# Patient Record
Sex: Female | Born: 1935 | Race: White | Hispanic: No | Marital: Married | Smoking: Never smoker
Health system: Southern US, Community
[De-identification: ages and names within clinical notes are randomized; demographics above are authoritative.]

## PROBLEM LIST (undated history)

## (undated) DIAGNOSIS — K5289 Other specified noninfective gastroenteritis and colitis: Secondary | ICD-10-CM

## (undated) DIAGNOSIS — D649 Anemia, unspecified: Secondary | ICD-10-CM

## (undated) DIAGNOSIS — F419 Anxiety disorder, unspecified: Secondary | ICD-10-CM

## (undated) DIAGNOSIS — J189 Pneumonia, unspecified organism: Secondary | ICD-10-CM

## (undated) DIAGNOSIS — M1710 Unilateral primary osteoarthritis, unspecified knee: Secondary | ICD-10-CM

## (undated) DIAGNOSIS — R51 Headache: Secondary | ICD-10-CM

## (undated) DIAGNOSIS — M810 Age-related osteoporosis without current pathological fracture: Secondary | ICD-10-CM

## (undated) DIAGNOSIS — T8859XA Other complications of anesthesia, initial encounter: Secondary | ICD-10-CM

## (undated) DIAGNOSIS — M179 Osteoarthritis of knee, unspecified: Secondary | ICD-10-CM

## (undated) DIAGNOSIS — K449 Diaphragmatic hernia without obstruction or gangrene: Secondary | ICD-10-CM

## (undated) DIAGNOSIS — B91 Sequelae of poliomyelitis: Secondary | ICD-10-CM

## (undated) DIAGNOSIS — E785 Hyperlipidemia, unspecified: Secondary | ICD-10-CM

## (undated) DIAGNOSIS — F411 Generalized anxiety disorder: Secondary | ICD-10-CM

## (undated) DIAGNOSIS — I10 Essential (primary) hypertension: Secondary | ICD-10-CM

## (undated) DIAGNOSIS — J309 Allergic rhinitis, unspecified: Secondary | ICD-10-CM

## (undated) DIAGNOSIS — K5790 Diverticulosis of intestine, part unspecified, without perforation or abscess without bleeding: Secondary | ICD-10-CM

## (undated) DIAGNOSIS — I739 Peripheral vascular disease, unspecified: Secondary | ICD-10-CM

## (undated) DIAGNOSIS — I517 Cardiomegaly: Secondary | ICD-10-CM

## (undated) DIAGNOSIS — M1990 Unspecified osteoarthritis, unspecified site: Secondary | ICD-10-CM

## (undated) DIAGNOSIS — E559 Vitamin D deficiency, unspecified: Secondary | ICD-10-CM

## (undated) DIAGNOSIS — K219 Gastro-esophageal reflux disease without esophagitis: Secondary | ICD-10-CM

## (undated) DIAGNOSIS — E7800 Pure hypercholesterolemia, unspecified: Secondary | ICD-10-CM

## (undated) DIAGNOSIS — M7120 Synovial cyst of popliteal space [Baker], unspecified knee: Secondary | ICD-10-CM

## (undated) DIAGNOSIS — M76899 Other specified enthesopathies of unspecified lower limb, excluding foot: Secondary | ICD-10-CM

## (undated) DIAGNOSIS — T4145XA Adverse effect of unspecified anesthetic, initial encounter: Secondary | ICD-10-CM

## (undated) DIAGNOSIS — I6529 Occlusion and stenosis of unspecified carotid artery: Secondary | ICD-10-CM

## (undated) HISTORY — DX: Diaphragmatic hernia without obstruction or gangrene: K44.9

## (undated) HISTORY — DX: Unilateral primary osteoarthritis, unspecified knee: M17.10

## (undated) HISTORY — DX: Osteoarthritis of knee, unspecified: M17.9

## (undated) HISTORY — PX: COLONOSCOPY: SHX174

## (undated) HISTORY — PX: OTHER SURGICAL HISTORY: SHX169

## (undated) HISTORY — DX: Cardiomegaly: I51.7

## (undated) HISTORY — DX: Sequelae of poliomyelitis: B91

## (undated) HISTORY — DX: Hyperlipidemia, unspecified: E78.5

## (undated) HISTORY — DX: Headache: R51

## (undated) HISTORY — DX: Anxiety disorder, unspecified: F41.9

## (undated) HISTORY — DX: Other specified noninfective gastroenteritis and colitis: K52.89

## (undated) HISTORY — DX: Pneumonia, unspecified organism: J18.9

## (undated) HISTORY — DX: Other specified enthesopathies of unspecified lower limb, excluding foot: M76.899

## (undated) HISTORY — DX: Gastro-esophageal reflux disease without esophagitis: K21.9

## (undated) HISTORY — DX: Generalized anxiety disorder: F41.1

## (undated) HISTORY — DX: Vitamin D deficiency, unspecified: E55.9

## (undated) HISTORY — DX: Allergic rhinitis, unspecified: J30.9

## (undated) HISTORY — DX: Pure hypercholesterolemia, unspecified: E78.00

## (undated) HISTORY — DX: Peripheral vascular disease, unspecified: I73.9

## (undated) HISTORY — DX: Occlusion and stenosis of unspecified carotid artery: I65.29

## (undated) HISTORY — DX: Anemia, unspecified: D64.9

## (undated) HISTORY — DX: Unspecified osteoarthritis, unspecified site: M19.90

## (undated) HISTORY — DX: Synovial cyst of popliteal space (Baker), unspecified knee: M71.20

## (undated) HISTORY — DX: Diverticulosis of intestine, part unspecified, without perforation or abscess without bleeding: K57.90

## (undated) HISTORY — DX: Age-related osteoporosis without current pathological fracture: M81.0

## (undated) HISTORY — PX: TONSILLECTOMY AND ADENOIDECTOMY: SHX28

## (undated) HISTORY — DX: Essential (primary) hypertension: I10

---
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
## 1990-04-09 HISTORY — PX: BREAST BIOPSY: SHX20

## 1992-04-09 HISTORY — PX: ENDOMETRIAL BIOPSY: SHX622

## 1999-08-09 ENCOUNTER — Ambulatory Visit: Admission: RE | Admit: 1999-08-09 | Discharge: 1999-08-09 | Payer: Self-pay | Admitting: Internal Medicine

## 2001-01-20 ENCOUNTER — Other Ambulatory Visit: Admission: RE | Admit: 2001-01-20 | Discharge: 2001-01-20 | Payer: Self-pay | Admitting: Obstetrics and Gynecology

## 2002-03-10 ENCOUNTER — Other Ambulatory Visit: Admission: RE | Admit: 2002-03-10 | Discharge: 2002-03-10 | Payer: Self-pay | Admitting: Gynecology

## 2002-03-24 ENCOUNTER — Ambulatory Visit (HOSPITAL_COMMUNITY): Admission: RE | Admit: 2002-03-24 | Discharge: 2002-03-24 | Payer: Self-pay | Admitting: Internal Medicine

## 2002-03-24 ENCOUNTER — Encounter: Payer: Self-pay | Admitting: Internal Medicine

## 2003-03-18 ENCOUNTER — Emergency Department (HOSPITAL_COMMUNITY): Admission: EM | Admit: 2003-03-18 | Discharge: 2003-03-18 | Payer: Self-pay | Admitting: Emergency Medicine

## 2003-06-09 ENCOUNTER — Other Ambulatory Visit: Admission: RE | Admit: 2003-06-09 | Discharge: 2003-06-09 | Payer: Self-pay | Admitting: Gynecology

## 2004-06-07 ENCOUNTER — Ambulatory Visit: Payer: Self-pay | Admitting: Internal Medicine

## 2004-09-06 ENCOUNTER — Ambulatory Visit: Payer: Self-pay | Admitting: Internal Medicine

## 2004-09-11 ENCOUNTER — Ambulatory Visit: Payer: Self-pay | Admitting: Internal Medicine

## 2004-09-25 ENCOUNTER — Ambulatory Visit: Payer: Self-pay

## 2005-03-29 ENCOUNTER — Ambulatory Visit: Payer: Self-pay | Admitting: Internal Medicine

## 2005-04-30 ENCOUNTER — Ambulatory Visit: Payer: Self-pay | Admitting: Internal Medicine

## 2005-05-01 ENCOUNTER — Ambulatory Visit: Payer: Self-pay | Admitting: Internal Medicine

## 2005-05-17 ENCOUNTER — Ambulatory Visit: Payer: Self-pay | Admitting: Internal Medicine

## 2005-06-07 ENCOUNTER — Ambulatory Visit (HOSPITAL_BASED_OUTPATIENT_CLINIC_OR_DEPARTMENT_OTHER): Admission: RE | Admit: 2005-06-07 | Discharge: 2005-06-07 | Payer: Self-pay | Admitting: Orthopedic Surgery

## 2005-08-07 ENCOUNTER — Other Ambulatory Visit: Admission: RE | Admit: 2005-08-07 | Discharge: 2005-08-07 | Payer: Self-pay | Admitting: Gynecology

## 2005-11-14 ENCOUNTER — Ambulatory Visit: Payer: Self-pay | Admitting: Internal Medicine

## 2005-11-16 ENCOUNTER — Ambulatory Visit: Payer: Self-pay | Admitting: Internal Medicine

## 2005-11-17 ENCOUNTER — Ambulatory Visit: Payer: Self-pay | Admitting: Internal Medicine

## 2006-07-08 ENCOUNTER — Ambulatory Visit: Payer: Self-pay | Admitting: Internal Medicine

## 2006-11-30 ENCOUNTER — Encounter: Payer: Self-pay | Admitting: Internal Medicine

## 2006-11-30 DIAGNOSIS — B91 Sequelae of poliomyelitis: Secondary | ICD-10-CM

## 2006-11-30 DIAGNOSIS — M7120 Synovial cyst of popliteal space [Baker], unspecified knee: Secondary | ICD-10-CM

## 2006-11-30 DIAGNOSIS — K219 Gastro-esophageal reflux disease without esophagitis: Secondary | ICD-10-CM | POA: Insufficient documentation

## 2006-11-30 DIAGNOSIS — M1990 Unspecified osteoarthritis, unspecified site: Secondary | ICD-10-CM

## 2006-11-30 DIAGNOSIS — F411 Generalized anxiety disorder: Secondary | ICD-10-CM | POA: Insufficient documentation

## 2006-11-30 DIAGNOSIS — E7800 Pure hypercholesterolemia, unspecified: Secondary | ICD-10-CM

## 2006-11-30 DIAGNOSIS — M899 Disorder of bone, unspecified: Secondary | ICD-10-CM | POA: Insufficient documentation

## 2006-11-30 DIAGNOSIS — M949 Disorder of cartilage, unspecified: Secondary | ICD-10-CM

## 2006-11-30 DIAGNOSIS — I10 Essential (primary) hypertension: Secondary | ICD-10-CM

## 2006-11-30 HISTORY — DX: Gastro-esophageal reflux disease without esophagitis: K21.9

## 2006-11-30 HISTORY — DX: Essential (primary) hypertension: I10

## 2006-11-30 HISTORY — DX: Unspecified osteoarthritis, unspecified site: M19.90

## 2006-11-30 HISTORY — DX: Pure hypercholesterolemia, unspecified: E78.00

## 2006-11-30 HISTORY — DX: Synovial cyst of popliteal space (Baker), unspecified knee: M71.20

## 2006-11-30 HISTORY — DX: Generalized anxiety disorder: F41.1

## 2006-11-30 HISTORY — DX: Sequelae of poliomyelitis: B91

## 2007-02-01 ENCOUNTER — Ambulatory Visit: Payer: Self-pay | Admitting: Internal Medicine

## 2007-04-16 ENCOUNTER — Ambulatory Visit: Payer: Self-pay | Admitting: Internal Medicine

## 2007-04-16 DIAGNOSIS — I739 Peripheral vascular disease, unspecified: Secondary | ICD-10-CM

## 2007-04-16 DIAGNOSIS — E785 Hyperlipidemia, unspecified: Secondary | ICD-10-CM | POA: Insufficient documentation

## 2007-04-16 DIAGNOSIS — R5381 Other malaise: Secondary | ICD-10-CM

## 2007-04-16 DIAGNOSIS — R5383 Other fatigue: Secondary | ICD-10-CM

## 2007-04-16 DIAGNOSIS — R109 Unspecified abdominal pain: Secondary | ICD-10-CM

## 2007-04-16 HISTORY — DX: Hyperlipidemia, unspecified: E78.5

## 2007-04-16 HISTORY — DX: Peripheral vascular disease, unspecified: I73.9

## 2007-04-17 LAB — CONVERTED CEMR LAB
ALT: 19 units/L (ref 0–35)
AST: 24 units/L (ref 0–37)
Albumin: 4.1 g/dL (ref 3.5–5.2)
Alkaline Phosphatase: 39 units/L (ref 39–117)
BUN: 13 mg/dL (ref 6–23)
Basophils Absolute: 0 10*3/uL (ref 0.0–0.1)
Basophils Relative: 0.3 % (ref 0.0–1.0)
CO2: 30 meq/L (ref 19–32)
Calcium: 9.7 mg/dL (ref 8.4–10.5)
Chloride: 104 meq/L (ref 96–112)
Cholesterol: 150 mg/dL (ref 0–200)
Creatinine, Ser: 0.7 mg/dL (ref 0.4–1.2)
HDL: 55.7 mg/dL (ref >39.0)
Hemoglobin: 12.6 g/dL (ref 12.0–15.0)
LDL Cholesterol: 76 mg/dL (ref 0–99)
MCHC: 34.7 g/dL (ref 30.0–36.0)
Monocytes Absolute: 0.3 10*3/uL (ref 0.2–0.7)
Monocytes Relative: 4.4 % (ref 3.0–11.0)
RBC: 3.97 M/uL (ref 3.87–5.11)
RDW: 12.6 % (ref 11.5–14.6)
Total Bilirubin: 0.8 mg/dL (ref 0.3–1.2)
Total CHOL/HDL Ratio: 2.7
Total Protein: 6.6 g/dL (ref 6.0–8.3)
VLDL: 18 mg/dL (ref 0–40)

## 2007-04-18 LAB — CONVERTED CEMR LAB: Vit D, 1,25-Dihydroxy: 48 (ref 30–89)

## 2007-04-21 ENCOUNTER — Encounter: Payer: Self-pay | Admitting: Internal Medicine

## 2007-06-06 ENCOUNTER — Ambulatory Visit: Payer: Self-pay | Admitting: Internal Medicine

## 2007-06-06 DIAGNOSIS — K5289 Other specified noninfective gastroenteritis and colitis: Secondary | ICD-10-CM

## 2007-06-06 DIAGNOSIS — E876 Hypokalemia: Secondary | ICD-10-CM

## 2007-06-06 HISTORY — DX: Other specified noninfective gastroenteritis and colitis: K52.89

## 2007-07-16 ENCOUNTER — Ambulatory Visit: Payer: Self-pay | Admitting: Internal Medicine

## 2007-09-02 ENCOUNTER — Telehealth: Payer: Self-pay | Admitting: Internal Medicine

## 2007-09-02 ENCOUNTER — Encounter: Payer: Self-pay | Admitting: Internal Medicine

## 2007-12-31 ENCOUNTER — Ambulatory Visit: Payer: Self-pay | Admitting: Internal Medicine

## 2007-12-31 DIAGNOSIS — R498 Other voice and resonance disorders: Secondary | ICD-10-CM

## 2007-12-31 DIAGNOSIS — J309 Allergic rhinitis, unspecified: Secondary | ICD-10-CM | POA: Insufficient documentation

## 2007-12-31 DIAGNOSIS — M76899 Other specified enthesopathies of unspecified lower limb, excluding foot: Secondary | ICD-10-CM

## 2007-12-31 HISTORY — DX: Other specified enthesopathies of unspecified lower limb, excluding foot: M76.899

## 2007-12-31 HISTORY — DX: Allergic rhinitis, unspecified: J30.9

## 2008-01-01 ENCOUNTER — Encounter: Payer: Self-pay | Admitting: Internal Medicine

## 2008-01-21 ENCOUNTER — Encounter: Admission: RE | Admit: 2008-01-21 | Discharge: 2008-01-21 | Payer: Self-pay | Admitting: Otolaryngology

## 2008-01-27 ENCOUNTER — Encounter: Admission: RE | Admit: 2008-01-27 | Discharge: 2008-01-27 | Payer: Self-pay | Admitting: Gynecology

## 2008-02-03 ENCOUNTER — Encounter: Payer: Self-pay | Admitting: Internal Medicine

## 2008-03-23 ENCOUNTER — Ambulatory Visit: Payer: Self-pay | Admitting: Internal Medicine

## 2008-04-09 DIAGNOSIS — I6529 Occlusion and stenosis of unspecified carotid artery: Secondary | ICD-10-CM

## 2008-04-09 HISTORY — DX: Occlusion and stenosis of unspecified carotid artery: I65.29

## 2008-04-28 ENCOUNTER — Ambulatory Visit: Payer: Self-pay | Admitting: Internal Medicine

## 2008-04-28 DIAGNOSIS — R1032 Left lower quadrant pain: Secondary | ICD-10-CM

## 2008-04-30 ENCOUNTER — Telehealth (INDEPENDENT_AMBULATORY_CARE_PROVIDER_SITE_OTHER): Payer: Self-pay | Admitting: *Deleted

## 2008-05-04 ENCOUNTER — Ambulatory Visit: Payer: Self-pay

## 2008-05-04 ENCOUNTER — Encounter: Payer: Self-pay | Admitting: Internal Medicine

## 2008-05-04 ENCOUNTER — Ambulatory Visit: Payer: Self-pay | Admitting: Cardiology

## 2008-05-10 ENCOUNTER — Ambulatory Visit: Payer: Self-pay | Admitting: Vascular Surgery

## 2008-05-13 ENCOUNTER — Ambulatory Visit: Payer: Self-pay | Admitting: Internal Medicine

## 2008-05-26 ENCOUNTER — Inpatient Hospital Stay (HOSPITAL_COMMUNITY): Admission: RE | Admit: 2008-05-26 | Discharge: 2008-05-27 | Payer: Self-pay | Admitting: Vascular Surgery

## 2008-05-26 ENCOUNTER — Encounter: Payer: Self-pay | Admitting: Vascular Surgery

## 2008-05-26 ENCOUNTER — Ambulatory Visit: Payer: Self-pay | Admitting: Vascular Surgery

## 2008-05-26 HISTORY — PX: CAROTID ENDARTERECTOMY: SUR193

## 2008-06-08 ENCOUNTER — Ambulatory Visit: Payer: Self-pay | Admitting: Vascular Surgery

## 2008-10-21 ENCOUNTER — Telehealth: Payer: Self-pay | Admitting: Internal Medicine

## 2008-10-22 ENCOUNTER — Ambulatory Visit: Payer: Self-pay | Admitting: Internal Medicine

## 2008-10-22 DIAGNOSIS — R51 Headache: Secondary | ICD-10-CM

## 2008-10-22 DIAGNOSIS — M549 Dorsalgia, unspecified: Secondary | ICD-10-CM | POA: Insufficient documentation

## 2008-10-22 DIAGNOSIS — M810 Age-related osteoporosis without current pathological fracture: Secondary | ICD-10-CM

## 2008-10-22 DIAGNOSIS — R519 Headache, unspecified: Secondary | ICD-10-CM | POA: Insufficient documentation

## 2008-10-22 HISTORY — DX: Age-related osteoporosis without current pathological fracture: M81.0

## 2008-10-22 HISTORY — DX: Headache: R51

## 2008-10-22 LAB — CONVERTED CEMR LAB: Sed Rate: 35 mm/hr — ABNORMAL HIGH (ref 0–22)

## 2008-12-07 ENCOUNTER — Ambulatory Visit: Payer: Self-pay | Admitting: Vascular Surgery

## 2008-12-17 ENCOUNTER — Telehealth: Payer: Self-pay | Admitting: Internal Medicine

## 2009-01-17 ENCOUNTER — Ambulatory Visit: Payer: Self-pay | Admitting: Internal Medicine

## 2009-01-17 DIAGNOSIS — M25569 Pain in unspecified knee: Secondary | ICD-10-CM | POA: Insufficient documentation

## 2009-04-12 ENCOUNTER — Encounter: Admission: RE | Admit: 2009-04-12 | Discharge: 2009-04-12 | Payer: Self-pay | Admitting: Gynecology

## 2009-05-03 ENCOUNTER — Ambulatory Visit: Payer: Self-pay | Admitting: Internal Medicine

## 2009-05-03 DIAGNOSIS — R700 Elevated erythrocyte sedimentation rate: Secondary | ICD-10-CM | POA: Insufficient documentation

## 2009-05-06 ENCOUNTER — Telehealth (INDEPENDENT_AMBULATORY_CARE_PROVIDER_SITE_OTHER): Payer: Self-pay | Admitting: *Deleted

## 2009-05-06 ENCOUNTER — Encounter: Payer: Self-pay | Admitting: Internal Medicine

## 2009-05-30 ENCOUNTER — Telehealth: Payer: Self-pay | Admitting: Internal Medicine

## 2009-05-31 ENCOUNTER — Encounter: Payer: Self-pay | Admitting: Internal Medicine

## 2009-05-31 ENCOUNTER — Ambulatory Visit: Payer: Self-pay | Admitting: Vascular Surgery

## 2009-06-07 ENCOUNTER — Telehealth: Payer: Self-pay | Admitting: Internal Medicine

## 2009-06-07 ENCOUNTER — Encounter: Payer: Self-pay | Admitting: Internal Medicine

## 2009-06-08 ENCOUNTER — Encounter: Payer: Self-pay | Admitting: Internal Medicine

## 2009-10-05 ENCOUNTER — Encounter: Payer: Self-pay | Admitting: Internal Medicine

## 2009-12-29 ENCOUNTER — Ambulatory Visit: Payer: Self-pay | Admitting: Internal Medicine

## 2010-03-20 ENCOUNTER — Ambulatory Visit: Payer: Self-pay | Admitting: Internal Medicine

## 2010-03-20 ENCOUNTER — Encounter: Payer: Self-pay | Admitting: Internal Medicine

## 2010-03-20 DIAGNOSIS — R079 Chest pain, unspecified: Secondary | ICD-10-CM

## 2010-03-21 ENCOUNTER — Telehealth: Payer: Self-pay | Admitting: Internal Medicine

## 2010-03-30 ENCOUNTER — Ambulatory Visit: Payer: Self-pay

## 2010-04-04 ENCOUNTER — Telehealth (INDEPENDENT_AMBULATORY_CARE_PROVIDER_SITE_OTHER): Payer: Self-pay | Admitting: *Deleted

## 2010-04-05 ENCOUNTER — Encounter: Payer: Self-pay | Admitting: Cardiology

## 2010-04-05 ENCOUNTER — Encounter (HOSPITAL_COMMUNITY)
Admission: RE | Admit: 2010-04-05 | Discharge: 2010-05-09 | Payer: Self-pay | Source: Home / Self Care | Attending: Internal Medicine | Admitting: Internal Medicine

## 2010-04-14 ENCOUNTER — Encounter: Payer: Self-pay | Admitting: Internal Medicine

## 2010-04-17 ENCOUNTER — Ambulatory Visit: Admission: RE | Admit: 2010-04-17 | Discharge: 2010-04-17 | Payer: Self-pay | Source: Home / Self Care

## 2010-04-17 ENCOUNTER — Encounter: Payer: Self-pay | Admitting: Internal Medicine

## 2010-05-05 ENCOUNTER — Other Ambulatory Visit: Payer: Self-pay | Admitting: Gynecology

## 2010-05-05 DIAGNOSIS — Z1231 Encounter for screening mammogram for malignant neoplasm of breast: Secondary | ICD-10-CM

## 2010-05-05 DIAGNOSIS — Z1239 Encounter for other screening for malignant neoplasm of breast: Secondary | ICD-10-CM

## 2010-05-07 LAB — CONVERTED CEMR LAB
Albumin: 4.3 g/dL (ref 3.5–5.2)
BUN: 12 mg/dL (ref 6–23)
Basophils Relative: 0.6 % (ref 0.0–3.0)
Bilirubin Urine: NEGATIVE
Bilirubin, Direct: 0.1 mg/dL (ref 0.0–0.3)
CO2: 29 meq/L (ref 19–32)
CRP, High Sensitivity: 5 (ref 0.00–5.00)
Calcium: 9.2 mg/dL (ref 8.4–10.5)
Chloride: 107 meq/L (ref 96–112)
Creatinine, Ser: 0.7 mg/dL (ref 0.4–1.2)
Creatinine, Ser: 0.7 mg/dL (ref 0.4–1.2)
Eosinophils Absolute: 0.1 10*3/uL (ref 0.0–0.7)
Eosinophils Absolute: 0.1 10*3/uL (ref 0.0–0.7)
Folate: 15 ng/mL
Folate: >20 ng/mL
GFR calc non Af Amer: 88 mL/min
Glucose, Bld: 84 mg/dL (ref 70–99)
HCT: 35.8 % — ABNORMAL LOW (ref 36.0–46.0)
HDL: 55.4 mg/dL (ref >39.0)
HDL: 59.1 mg/dL (ref >39.00)
Iron: 69 ug/dL (ref 42–145)
Ketones, ur: NEGATIVE mg/dL
LDL Cholesterol: 55 mg/dL (ref 0–99)
MCHC: 33.2 g/dL (ref 30.0–36.0)
MCV: 92.1 fL (ref 78.0–100.0)
MCV: 93.3 fL (ref 78.0–100.0)
Monocytes Absolute: 0.4 10*3/uL (ref 0.1–1.0)
Monocytes Absolute: 0.4 10*3/uL (ref 0.1–1.0)
Monocytes Relative: 7.4 % (ref 3.0–12.0)
Neutrophils Relative %: 69.1 % (ref 43.0–77.0)
Neutrophils Relative %: 69.7 % (ref 43.0–77.0)
Nitrite: NEGATIVE
PTH: 58.7 pg/mL (ref 14.0–72.0)
Platelets: 199 10*3/uL (ref 150–400)
RBC: 3.79 M/uL — ABNORMAL LOW (ref 3.87–5.11)
RDW: 12.4 % (ref 11.5–14.6)
Sed Rate: 33 mm/hr — ABNORMAL HIGH (ref 0–22)
Sodium: 141 meq/L (ref 135–145)
Specific Gravity, Urine: 1.025 (ref 1.000–1.030)
TSH: 1.31 microintl units/mL (ref 0.35–5.50)
Total Bilirubin: 0.8 mg/dL (ref 0.3–1.2)
Total CHOL/HDL Ratio: 2
Total Protein, Urine: NEGATIVE mg/dL
Total Protein, Urine: NEGATIVE mg/dL
Total Protein: 7.6 g/dL (ref 6.0–8.3)
Triglycerides: 66 mg/dL (ref 0.0–149.0)
Urine Glucose: NEGATIVE mg/dL
Urobilinogen, UA: 0.2 (ref 0.0–1.0)
VLDL: 13 mg/dL (ref 0–40)
Vitamin B-12: 359 pg/mL (ref 211–911)
pH: 5.5 (ref 5.0–8.0)

## 2010-05-08 ENCOUNTER — Ambulatory Visit
Admission: RE | Admit: 2010-05-08 | Discharge: 2010-05-08 | Payer: Self-pay | Source: Home / Self Care | Attending: Internal Medicine | Admitting: Internal Medicine

## 2010-05-08 ENCOUNTER — Other Ambulatory Visit: Payer: Self-pay | Admitting: Internal Medicine

## 2010-05-08 ENCOUNTER — Encounter: Payer: Self-pay | Admitting: Internal Medicine

## 2010-05-08 DIAGNOSIS — I517 Cardiomegaly: Secondary | ICD-10-CM | POA: Insufficient documentation

## 2010-05-08 DIAGNOSIS — E559 Vitamin D deficiency, unspecified: Secondary | ICD-10-CM

## 2010-05-08 HISTORY — DX: Vitamin D deficiency, unspecified: E55.9

## 2010-05-08 HISTORY — DX: Cardiomegaly: I51.7

## 2010-05-08 LAB — CBC WITH DIFFERENTIAL/PLATELET
Basophils Absolute: 0 K/uL (ref 0.0–0.1)
Basophils Relative: 0.4 % (ref 0.0–3.0)
Eosinophils Absolute: 0.1 K/uL (ref 0.0–0.7)
Eosinophils Relative: 1.8 % (ref 0.0–5.0)
HCT: 36.2 % (ref 36.0–46.0)
Hemoglobin: 12.5 g/dL (ref 12.0–15.0)
Lymphocytes Relative: 22.6 % (ref 12.0–46.0)
Lymphs Abs: 1.4 K/uL (ref 0.7–4.0)
MCHC: 34.6 g/dL (ref 30.0–36.0)
MCV: 92.3 fl (ref 78.0–100.0)
Monocytes Absolute: 0.5 K/uL (ref 0.1–1.0)
Monocytes Relative: 8.3 % (ref 3.0–12.0)
Neutro Abs: 4.2 K/uL (ref 1.4–7.7)
Neutrophils Relative %: 66.9 % (ref 43.0–77.0)
Platelets: 210 K/uL (ref 150.0–400.0)
RBC: 3.92 Mil/uL (ref 3.87–5.11)
RDW: 14.3 % (ref 11.5–14.6)
WBC: 6.3 K/uL (ref 4.5–10.5)

## 2010-05-08 LAB — HEPATIC FUNCTION PANEL
AST: 18 U/L (ref 0–37)
Albumin: 4.4 g/dL (ref 3.5–5.2)
Alkaline Phosphatase: 40 U/L (ref 39–117)
Bilirubin, Direct: 0.1 mg/dL (ref 0.0–0.3)
Total Protein: 7.3 g/dL (ref 6.0–8.3)

## 2010-05-08 LAB — LIPID PANEL
Total CHOL/HDL Ratio: 2
VLDL: 18.4 mg/dL (ref 0.0–40.0)

## 2010-05-08 LAB — URINALYSIS
Bilirubin Urine: NEGATIVE
Ketones, ur: NEGATIVE
Leukocytes, UA: NEGATIVE
Nitrite: NEGATIVE
Specific Gravity, Urine: >=1.03 (ref 1.000–1.030)
Urobilinogen, UA: 0.2 (ref 0.0–1.0)
pH: 5.5 (ref 5.0–8.0)

## 2010-05-08 LAB — BASIC METABOLIC PANEL WITH GFR
BUN: 18 mg/dL (ref 6–23)
CO2: 28 meq/L (ref 19–32)
Calcium: 9.2 mg/dL (ref 8.4–10.5)
Chloride: 104 meq/L (ref 96–112)
Creatinine, Ser: 0.6 mg/dL (ref 0.4–1.2)
GFR: 105.92 mL/min
Glucose, Bld: 90 mg/dL (ref 70–99)
Potassium: 4.4 meq/L (ref 3.5–5.1)
Sodium: 139 meq/L (ref 135–145)

## 2010-05-08 LAB — TSH: TSH: 1.53 u[IU]/mL (ref 0.35–5.50)

## 2010-05-10 ENCOUNTER — Ambulatory Visit (HOSPITAL_COMMUNITY): Payer: MEDICARE | Attending: Internal Medicine

## 2010-05-10 DIAGNOSIS — I10 Essential (primary) hypertension: Secondary | ICD-10-CM | POA: Insufficient documentation

## 2010-05-10 DIAGNOSIS — I517 Cardiomegaly: Secondary | ICD-10-CM

## 2010-05-10 DIAGNOSIS — E7800 Pure hypercholesterolemia, unspecified: Secondary | ICD-10-CM | POA: Insufficient documentation

## 2010-05-10 DIAGNOSIS — I059 Rheumatic mitral valve disease, unspecified: Secondary | ICD-10-CM | POA: Insufficient documentation

## 2010-05-10 DIAGNOSIS — Z8249 Family history of ischemic heart disease and other diseases of the circulatory system: Secondary | ICD-10-CM | POA: Insufficient documentation

## 2010-05-10 DIAGNOSIS — I079 Rheumatic tricuspid valve disease, unspecified: Secondary | ICD-10-CM | POA: Insufficient documentation

## 2010-05-11 NOTE — Medication Information (Signed)
Summary: Denied/[facility]  Denied/[facility]   Imported By: Lester Brazos 06/15/2009 08:17:48  _____________________________________________________________________  External Attachment:    Type:   Image     Comment:   External Document

## 2010-05-11 NOTE — Medication Information (Signed)
Summary: [facility]  [facility]   Imported By: [name] 06/09/2009 07:45:53  _____________________________________________________________________  External Attachment:    Type:   Image     Comment:   External Document

## 2010-05-11 NOTE — Progress Notes (Signed)
Summary: Referral script  Phone Note Call from Patient Call back at Home Phone [phone]   Caller: Patient Call For: Corwin Levins MD Reason for Call: Referral Summary of Call: Patient went to her first physical therapy appointment today, and has two more scheduled. However, since she is a Medicare patient, they informed her that they need a written prescription faxed to them for her physical therapy order. She is going to [facility] and their fax number is [phone], and the phone number is [phone]. Initial call taken by: Irma Newness,  May 06, 2009 9:29 AM  Follow-up for Phone Call        can this be taken care of by [facility]? Follow-up by: Corwin Levins MD,  May 06, 2009 1:02 PM  Additional Follow-up for Phone Call Additional follow up Details #1::         faxed again referral form  to  physical Therapy and  fax number is [phone], Additional Follow-up by: Shelbie Proctor,  May 06, 2009 4:40 PM

## 2010-05-11 NOTE — Progress Notes (Signed)
Summary: Labs  Phone Note Call from Patient Call back at Home Phone [phone]   Caller: Patient Summary of Call: pt called requesting copies of her last lab to pick up for upcoming consult. Copies upfront Initial call taken by: Margaret Pyle, CMA,  May 30, 2009 3:26 PM

## 2010-05-11 NOTE — Assessment & Plan Note (Signed)
Summary: MINOR CHEST PAIN--BP ELEV: 159/77 -PER DAHLIA SCHED--STC   Vital Signs:  Patient profile:   76 year old female Height:      61 inches Weight:      175.50 pounds BMI:     33.28 O2 Sat:      97 % on Room air Temp:     98.5 degrees F oral Pulse rate:   78 / minute BP sitting:   152 / 80  (left arm) Cuff size:   regular  Vitals Entered By: Zella Ball Ewing CMA Duncan Dull) (March 20, 2010 3:57 PM)  O2 Flow:  Room air CC: Chest pain/RE   CC:  Chest pain/RE.  History of Present Illness: here with recent mult BP's at home elev similar today;  BP machine validated today; seemed to happen rather suddenly in the past 2 wks as BP prior to that had been usually < 140/90;  has gained 5 lbs since jan 2011 ,  but is trying to follow lower salt diet, lower chol diet   Also had onset yest am with recurring chest pains, mid to upper mid chest, dull and sharp, lasting less than 1 min each time , and not assoc with radiation, n/v, diaphoresis, sob, not clearly exertional and non pleuritic.   No current pain, no sour brash, dysphagia, abd pain, bowel change, blood.  Did wake up at 330 am with shivers but no fever and none further since that time.  Last stress test > 3 yrs.  Is s/p right CEA, and known hiatal hernia . Overall good compliance with meds, and good tolerability. Pt denies new neuro symptoms such as headache, facial or extremity weakness Pt denies polydipsia, polyuria   Overall good compliance with meds, trying to follow low chol diet, wt stable, little excercise however      Preventive Screening-Counseling & Management      Drug Use:  no.    Problems Prior to Update: 1)  Chest Pain  (ICD-786.50) 2)  Post-polio Syndrome  (ICD-138) 3)  Preventive Health Care  (ICD-V70.0) 4)  Sedimentation Rate, Elevated  (ICD-790.1) 5)  Knee Pain, Right, Acute  (ICD-719.46) 6)  Back Pain  (ICD-724.5) 7)  Headache  (ICD-784.0) 8)  Osteoporosis  (ICD-733.00) 9)  Abdominal Pain, Left Lower Quadrant   (ICD-789.04) 10)  Bursitis, Left Hip  (ICD-726.5) 11)  Other Voice Disturbance  (ICD-784.49) 12)  Allergic Rhinitis  (ICD-477.9) 13)  Hypokalemia  (ICD-276.8) 14)  Gastroenteritis Without Dehydration  (ICD-558.9) 15)  Abdominal Pain, Lower  (ICD-789.09) 16)  Peripheral Vascular Disease  (ICD-443.9) 17)  Hyperlipidemia  (ICD-272.4) 18)  Fatigue  (ICD-780.79) 19)  Post-polio Syndrome  (ICD-138) 20)  Hypercholesterolemia  (ICD-272.0) 21)  Degenerative Joint Disease  (ICD-715.90) 22)  Hx of Baker's Cyst, Right Knee  (ICD-727.51) 23)  Osteopenia  (ICD-733.90) 24)  Hypertension  (ICD-401.9) 25)  Gerd  (ICD-530.81) 26)  Anxiety  (ICD-300.00)  Medications Prior to Update: 1)  Crestor 20 Mg Tabs (Rosuvastatin Calcium) .Marland Kitchen.. 1 By Mouth Once Daily 2)  Diovan 320 Mg Tabs (Valsartan) .... Take 1/2 Tablet By Mouth Once A Day 3)  Ecotrin Low Strength 81 Mg  Tbec (Aspirin) .Marland Kitchen.. 1 By Mouth Qd 4)  Nexium 40 Mg  Cpdr (Esomeprazole Magnesium) .... Take 1 By Mouth Once Daily 5)  Adult Aspirin Ec Low Strength 81 Mg Tbec (Aspirin) .Marland Kitchen.. 1po Once Daily 6)  Estrace 0.1 Mg/gm Crea (Estradiol) .... Apply Small Amount To Inner Thigh 2- 3 Times A Week 7)  First-Testosterone  Mc 2 % Crea (Testosterone Propionate) .... Apply Small Amount To Inner Thigh 3-4 Times A Week 8)  Calcium 1200mg  .... 1 By Mouth Once Daily 9)  Vitamin D 2000 Unit Tabs (Cholecalciferol) .Marland Kitchen.. 1 By Mouth Once Daily 10)  Atelvia 35 Mg .Marland KitchenMarland KitchenMarland Kitchen 1po Q Wk  Current Medications (verified): 1)  Crestor 20 Mg Tabs (Rosuvastatin Calcium) .Marland Kitchen.. 1 By Mouth Once Daily 2)  Diovan 320 Mg Tabs (Valsartan) .... Take 1 Tablet By Mouth Once A Day 3)  Ecotrin Low Strength 81 Mg  Tbec (Aspirin) .Marland Kitchen.. 1 By Mouth Qd 4)  Nexium 40 Mg  Cpdr (Esomeprazole Magnesium) .... Take 1 By Mouth Once Daily 5)  Adult Aspirin Ec Low Strength 81 Mg Tbec (Aspirin) .Marland Kitchen.. 1po Once Daily 6)  Estrace 0.1 Mg/gm Crea (Estradiol) .... Apply Small Amount To Inner Thigh 2- 3 Times A Week 7)   First-Testosterone Mc 2 % Crea (Testosterone Propionate) .... Apply Small Amount To Inner Thigh 3-4 Times A Week 8)  Calcium 1200mg  .... 1 By Mouth Once Daily 9)  Vitamin D 2000 Unit Tabs (Cholecalciferol) .Marland Kitchen.. 1 By Mouth Once Daily 10)  Atelvia 35 Mg .Marland KitchenMarland KitchenMarland Kitchen 1po Q Wk  Allergies (verified): 1)  ! Sulfa 2)  ! * Latex 3)  ! 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   Head:  normocephalic and atraumatic.   Eyes:  vision grossly intact, pupils equal, and pupils round.   Ears:  R ear normal and L ear normal.   Nose:  no external deformity and no nasal discharge.   Mouth:  no gingival abnormalities and pharynx pink and moist.   Neck:  supple and no masses.   Lungs:  normal respiratory effort and normal breath sounds.   Heart:  normal rate and regular rhythm.   Abdomen:  soft, non-tender, and normal bowel sounds.   Msk:  no joint tenderness and no joint swelling.  , no chest wall tender Extremities:  no edema, no erythema    Impression & Recommendations:  Problem # 1:  HYPERTENSION (ICD-401.9) Assessment Deteriorated  Her updated medication list for this problem includes:    Diovan 320 Mg Tabs  (Valsartan) .Marland Kitchen... Take 1 tablet by mouth once a day  Orders: Radiology Referral (Radiology) uncontrolled - to incr the diovan to 320 mg, cont to monitor at home and next visit has also hx of CT with mild RAS - will check f/u renal artery u/s  Problem # 2:  CHEST PAIN (ICD-786.50) atypical, with mult CRF' s including carotid dz ; ecg reviewed,  to check cxr and stress test Orders: Cardiolite (Cardiolite) EKG w/ Interpretation (93000) T-2 View CXR, Same Day (71020.5TC)  Problem # 3:  HYPERLIPIDEMIA (ICD-272.4) Assessment: Unchanged  Her updated medication list for this problem includes:  Crestor 20 Mg Tabs (Rosuvastatin calcium) .Marland Kitchen... 1 by mouth once daily  Labs Reviewed: SGOT: 19 (05/03/2009)   SGPT: 15 (05/03/2009)   HDL:59.10 (05/03/2009), 55.4 (04/28/2008)  LDL:56 (05/03/2009), 55 (04/28/2008)  Chol:128 (05/03/2009), 124 (04/28/2008)  Trig:66.0 (05/03/2009), 66 (04/28/2008) stable overall by hx and exam, ok to continue meds/tx as is , Pt to continue diet efforts, good med tolerance;  goal LDL less than 70   Complete Medication List: 1)  Crestor 20 Mg Tabs (Rosuvastatin calcium) .Marland Kitchen.. 1 by mouth once daily 2)  Diovan 320 Mg Tabs (Valsartan) .... Take 1 tablet by mouth once a day 3)  Ecotrin Low Strength 81 Mg Tbec (Aspirin) .Marland Kitchen.. 1 by mouth qd 4)  Nexium 40 Mg Cpdr (Esomeprazole magnesium) .... Take 1 by mouth once daily 5)  Adult Aspirin Ec Low Strength 81 Mg Tbec (Aspirin) .Marland Kitchen.. 1po once daily 6)  Estrace 0.1 Mg/gm Crea (Estradiol) .... Apply small amount to inner thigh 2- 3 times a week 7)  First-testosterone Mc 2 % Crea (Testosterone propionate) .... Apply small amount to inner thigh 3-4 times a week 8)  Calcium 1200mg   .... 1 by mouth once daily 9)  Vitamin D 2000 Unit Tabs (Cholecalciferol) .Marland Kitchen.. 1 by mouth once daily 10)  Atelvia 35 Mg  .Marland KitchenMarland KitchenMarland Kitchen 1po q wk  Patient Instructions: 1)  increase the diovan to 320 mg per day 2)  Your EKG was ok today 3)  Please go to Radiology in  the basement level for your X-Ray today  4)  You will be contacted about the referral(s) to: stress test, and the ultrasound for renal arteries 5)  Please schedule a follow-up appointment as you have planned Prescriptions: DIOVAN 320 MG TABS (VALSARTAN) Take 1 tablet by mouth once a day  #90 x 3   Entered and Authorized by:   Corwin Levins MD   Signed by:   Corwin Levins MD on 03/20/2010   Method used:   Print then Give to Patient   RxID:   [id number]    Orders Added: 1)  EKG w/ Interpretation [93000] 2)  Radiology Referral [Radiology] 3)  Cardiolite [Cardiolite] 4)  EKG w/ Interpretation [93000] 5)  T-2 View CXR, Same Day [71020.5TC] 6)  Est. Patient Level IV [62130]

## 2010-05-11 NOTE — Assessment & Plan Note (Signed)
Summary: CPX/ SECURE HORIZIONS /NWS #   Vital Signs:  Patient profile:   76 year old female Height:      61 inches Weight:      170 pounds BMI:     32.24 O2 Sat:      98 % on Room air Temp:     97.2 degrees F oral Pulse rate:   67 / minute BP sitting:   150 / 90  (left arm) Cuff size:   regular  Vitals Entered ByZella Ball Ewing (May 03, 2009 8:52 AM)  O2 Flow:  Room air  CC: Adult physical/RE   CC:  Adult physical/RE.  History of Present Illness: here for wellness, right leg is improved form last visit after twisting the leg and pain is improved;  still taking tylenol as needed but also having some weakness to both legs she attributes to post polio;  had to get get up on exam table now;  now has stairlift at home for the stairs;  had PT last spring and better then, but now worse again;  Pt denies CP, sob, doe, wheezing, orthopnea, pnd, worsening LE edema, palps, dizziness or syncope  Pt denies new neuro symptoms such as headache, facial or extremity weakness   Problems Prior to Update: 1)  Post-polio Syndrome  (ICD-138) 2)  Preventive Health Care  (ICD-V70.0) 3)  Sedimentation Rate, Elevated  (ICD-790.1) 4)  Knee Pain, Right, Acute  (ICD-719.46) 5)  Back Pain  (ICD-724.5) 6)  Headache  (ICD-784.0) 7)  Osteoporosis  (ICD-733.00) 8)  Abdominal Pain, Left Lower Quadrant  (ICD-789.04) 9)  Bursitis, Left Hip  (ICD-726.5) 10)  Other Voice Disturbance  (ICD-784.49) 11)  Allergic Rhinitis  (ICD-477.9) 12)  Hypokalemia  (ICD-276.8) 13)  Gastroenteritis Without Dehydration  (ICD-558.9) 14)  Abdominal Pain, Lower  (ICD-789.09) 15)  Peripheral Vascular Disease  (ICD-443.9) 16)  Hyperlipidemia  (ICD-272.4) 17)  Fatigue  (ICD-780.79) 18)  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 1 By Mouth Once Daily 2)  Diovan 320 Mg Tabs (Valsartan) .... Take 1/2 Tablet By Mouth Once A Day 3)  Ecotrin Low Strength 81 Mg  Tbec (Aspirin) .Marland Kitchen.. 1 By Mouth Qd 4)  Nexium 40 Mg  Cpdr (Esomeprazole Magnesium) .... Take 1 By Mouth Two Times A Day 5)  Actonel 150 Mg Tabs (Risedronate Sodium) .Marland Kitchen.. 1po Once A Month 6)  Adult Aspirin Ec Low Strength 81 Mg Tbec (Aspirin) .Marland Kitchen.. 1po Once Daily 7)  Estrace 0.1 Mg/gm Crea (Estradiol) .... Apply Small Amount To Inner Thigh 2- 3 Times A Week 8)  First-Testosterone Mc 2 % Crea (Testosterone Propionate) .... Apply Small Amount To Inner Thigh 3-4 Times A Week 9)  Calcium 1200mg  .... 1 By Mouth Once Daily 10)  Vitamin D 2000 Unit Tabs (Cholecalciferol) .Marland Kitchen.. 1 By Mouth Once Daily 11)  Dexilant 60 Mg Cpdr (Dexlansoprazole) .Marland Kitchen.. 1 By Mouth Once Daily  Current Medications (verified): 1)  Crestor 20 Mg Tabs (Rosuvastatin Calcium) .Marland Kitchen.. 1 By Mouth Once Daily 2)  Diovan 320 Mg Tabs (Valsartan) .... Take 1/2 Tablet By Mouth Once A Day 3)  Ecotrin Low Strength 81 Mg  Tbec (Aspirin) .Marland Kitchen.. 1 By Mouth Qd 4)  Nexium 40 Mg  Cpdr (Esomeprazole Magnesium) .... Take 1 By Mouth Once Daily 5)  Adult Aspirin Ec Low Strength 81 Mg Tbec (Aspirin) .Marland Kitchen.. 1po Once Daily 6)  Estrace 0.1 Mg/gm Crea (Estradiol) .... Apply Small Amount To Inner Thigh 2- 3 Times A Week 7)  First-Testosterone Mc 2 % Crea (Testosterone Propionate) .... Apply Small Amount To Inner Thigh 3-4 Times A Week 8)  Calcium 1200mg  .... 1 By Mouth Once Daily 9)  Vitamin D 2000 Unit Tabs (Cholecalciferol) .Marland Kitchen.. 1 By Mouth Once Daily 10)  Atelvia 35 Mg .Marland KitchenMarland KitchenMarland Kitchen 1po Q Wk  Allergies (verified): 1)  ! Sulfa 2)  ! * Latex 3)  ! 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         all otherwise negative per pt - 12 system review done   Physical Exam  General:  alert and overweight-appearing.   Head:  normocephalic and atraumatic.   Eyes:  vision grossly intact, pupils equal, and pupils round.   Ears:  R ear normal and L ear normal.   Nose:  no external deformity and no nasal discharge.   Mouth:  no gingival abnormalities and pharynx pink and moist.   Neck:  supple and no masses.   Lungs:  normal respiratory effort and normal breath sounds.   Heart:  normal rate and regular rhythm.   Abdomen:  soft, non-tender, and normal bowel sounds.   Msk:  no joint tenderness and no joint swelling.   Extremities:  no edema, no erythema  Neurologic:  cranial nerves II-XII intact, strength normal in all extremities, and sensation intact to light touch. except for right quad /thigh weakness, and chronic LLE weakness diffusely     Impression & Recommendations:  Problem # 1:  Preventive Health Care  (ICD-V70.0)  Overall doing well, age appropriate education and counseling updated and referral for appropriate preventive services done unless declined, immunizations up to date or declined, diet counseling done if overweight, urged to quit smoking if smokes , most recent labs reviewed and current ordered if appropriate, ecg reviewed or declined (interpretation per ECG scanned in the EMR if done); information regarding Medicare Prevention requirements given if appropriate   Orders: EKG w/ Interpretation (93000) TLB-BMP (Basic Metabolic Panel-BMET) (80048-METABOL) TLB-CBC Platelet - w/Differential (85025-CBCD) TLB-Hepatic/Liver Function Pnl (80076-HEPATIC) TLB-Lipid Panel (80061-LIPID) TLB-TSH (Thyroid Stimulating Hormone) (84443-TSH) TLB-Udip ONLY (81003-UDIP) T-Vitamin D (25-Hydroxy) (95621-30865)  Problem # 2:  SEDIMENTATION RATE, ELEVATED (ICD-790.1)  wilo add esr adn crp  Orders: TLB-Sedimentation Rate (ESR) (85652-ESR)  Problem # 3:  OSTEOPOROSIS (ICD-733.00)  The following medications were removed from the medication list:    Actonel 150 Mg Tabs (Risedronate sodium) .Marland KitchenMarland KitchenMarland KitchenMarland Kitchen  1po once a month with some GI upset with tading the actonel - will chagne to atelvia weekly  Problem # 4:  HYPERTENSION (ICD-401.9)  Her updated medication list for this problem includes:    Diovan 320 Mg Tabs (Valsartan) .Marland Kitchen... Take 1/2 tablet by mouth once a day BP at home < 140/90, ok to cont meds as is;  to bring in machine next visit   Problem # 5:  POST-POLIO SYNDROME (ICD-138)  ok ot refer  back to PT -   Orders: Misc. Referral (Misc. Ref)  Complete Medication List: 1)  Crestor 20 Mg Tabs (Rosuvastatin calcium) .Marland Kitchen.. 1 by mouth once daily 2)  Diovan 320 Mg Tabs (Valsartan) .... Take 1/2 tablet by mouth once a day 3)  Ecotrin Low Strength 81 Mg Tbec (Aspirin) .Marland Kitchen.. 1 by mouth qd 4)  Nexium 40 Mg Cpdr (Esomeprazole magnesium) .... Take 1 by mouth once daily 5)  Adult Aspirin Ec Low Strength 81 Mg  Tbec (Aspirin) .Marland Kitchen.. 1po once daily 6)  Estrace 0.1 Mg/gm Crea (Estradiol) .... Apply small amount to inner thigh 2- 3 times a week 7)  First-testosterone Mc 2 % Crea (Testosterone propionate) .... Apply small amount to inner thigh 3-4 times a week 8)  Calcium 1200mg   .... 1 by mouth once daily 9)  Vitamin D 2000 Unit Tabs (Cholecalciferol) .Marland Kitchen.. 1 by mouth once daily 10)  Atelvia 35 Mg  .Marland KitchenMarland KitchenMarland Kitchen 1po q wk  Other Orders: TLB-CRP-High Sensitivity (C-Reactive Protein) (86140-FCRP) T-Parathyroid Hormone, Intact w/ Calcium (40981-19147) TD Toxoids IM 7 YR + (82956) Admin 1st Vaccine (21308)  Patient Instructions: 1)  you had the tetanus shot today 2)  You will be contacted about the referral(s) to: PT 3)  Please go to the Lab in the basement for your blood and/or urine tests today 4)  stop the actonel when done 5)  start the atelvia 35 mg per wk 6)  Please schedule a follow-up appointment in 1 year or sooner if needed Prescriptions: CRESTOR 20 MG TABS (ROSUVASTATIN CALCIUM) 1 by mouth once daily  #30 x 11   Entered and Authorized by:   Corwin Levins MD   Signed by:   Corwin Levins MD on 05/03/2009   Method used:   Print then Give to Patient   RxID:   [id number] NEXIUM 40 MG  CPDR (ESOMEPRAZOLE MAGNESIUM) TAKE 1 by mouth once daily  #90 x 3   Entered and Authorized by:   Corwin Levins MD   Signed by:   Corwin Levins MD on 05/03/2009   Method used:   Print then Give to Patient   RxID:   [id number] DIOVAN 320 MG TABS (VALSARTAN) Take 1 tablet by mouth once a day  #90 x 3   Entered and Authorized by:   Corwin Levins MD   Signed by:   Corwin Levins MD on 05/03/2009   Method used:   Print then Give to Patient   RxID:   [id number] CRESTOR 20 MG TABS (ROSUVASTATIN CALCIUM) 1 by mouth once daily  #90 x 3   Entered and Authorized by:   Corwin Levins MD   Signed by:   Corwin Levins MD on 05/03/2009   Method used:   Print then Give to Patient   RxID:   [id number] ATELVIA 35 MG 1po q  wk  #12 x 3   Entered and Authorized by:   Corwin Levins MD   Signed by:   Fayrene Fearing  Ellin Mayhew MD on 05/03/2009   Method used:   Print then Give to Patient   RxID:   [id number]    Immunization History:  Influenza Immunization History:    Influenza:  historical (12/08/2008)  Immunizations Administered:  Tetanus Vaccine:    Vaccine Type: Td    Site: right deltoid    Mfr: Sanofi Pasteur    Dose: 0.5 ml    Route: IM    Given by: Zella Ball Ewing    Exp. Date: 02/22/2011    Lot #: [id number]    VIS given: 02/25/07 version given May 03, 2009.

## 2010-05-11 NOTE — Medication Information (Signed)
Summary: Christina [name] DENIED/[facility]  Christina [name] DENIED/[facility]   Imported By: [name] 06/09/2009 07:48:49  _____________________________________________________________________  External Attachment:    Type:   Image     Comment:   External Document

## 2010-05-11 NOTE — Miscellaneous (Signed)
Summary: [facility]  [facility]   Imported By: Lester Lockbourne 05/24/2009 08:01:22  _____________________________________________________________________  External Attachment:    Type:   Image     Comment:   External Document

## 2010-05-11 NOTE — Assessment & Plan Note (Signed)
Summary: Cardiology Nuclear Testing  Nuclear Med Background Indications for Stress Test: Evaluation for Ischemia   History: Myocardial Perfusion Study  History Comments: '98 ZOX:WRUEAV, EF=71%  Symptoms: Chest Pressure, Fatigue  Symptoms Comments: Last episode of CP:2 weeks ago   Nuclear Pre-Procedure Cardiac Risk Factors: Carotid Disease, Hypertension, Lipids, Obesity, PVD Caffeine/Decaff Intake: none NPO After: 6:30 PM Lungs: Clear.  O2 Sat 99% on RA. IV 0.9% NS with Angio Cath: 20g     IV Site: R Antecubital IV Started by: Stanton Kidney, EMT-P Chest Size (in) 38     Cup Size C     Height (in): 61 Weight (lb): 172 BMI: 32.62  Nuclear Med Study 1 or 2 day study:  1 day     Stress Test Type:  Treadmill/Lexiscan Reading MD:  Olga Millers, MD     Referring MD:  Oliver Barre, MD Resting Radionuclide:  Technetium 37m Tetrofosmin     Resting Radionuclide Dose:  11 mCi  Stress Radionuclide:  Technetium 58m Tetrofosmin     Stress Radionuclide Dose:  33 mCi   Stress Protocol Exercise Time (min):  2:00 min     Max HR:  121 bpm     Predicted Max HR:  147 bpm  Max Systolic BP: 196 mm Hg     Percent Max HR:  82.31 %Rate Pressure Product:  40981  Lexiscan: 0.4 mg   Stress Test Technologist:  Rea College, CMA-N     Nuclear Technologist:  Doyne Keel, CNMT  Rest Procedure  Myocardial perfusion imaging was performed at rest 45 minutes following the intravenous administration of Technetium 66m Tetrofosmin.  Stress Procedure  The patient received IV Lexiscan 0.4 mg over 15-seconds with concurrent low level exercise and then Technetium 62m Tetrofosmin was injected at 30-seconds.  There were no significant changes with Lexiscan.  Quantitative spect images were obtained after a 45 minute delay.  QPS Raw Data Images:  Acquisition technically good; normal left ventricular size. Stress Images:  There is decreased uptake in the distal anterior wall. Rest Images:  There is decreased uptake in  the distal anterior wall. Subtraction (SDS):  No evidence of ischemia. Transient Ischemic Dilatation:  .97  (Normal <1.22)  Lung/Heart Ratio:  .39  (Normal <0.45)  Quantitative Gated Spect Images QGS EDV:  60 ml QGS ESV:  17 ml QGS EF:  72 % QGS cine images:  Normal wall motion.   Overall Impression  Exercise Capacity: Lexiscan with no exercise. BP Response: Normal blood pressure response. Clinical Symptoms: There is chest pain ECG Impression: Insignificant upsloping ST segment depression. Overall Impression: Normal lexiscan nuclear study with soft tissue attenuation but no ischemia.  Appended Document: Cardiology Nuclear Testing LMOPT - labs negative, normal, or stable  - No Acute problem

## 2010-05-11 NOTE — Miscellaneous (Signed)
Summary: Orders Update  Clinical Lists Changes  Orders: Added new Test order of Renal Artery Duplex (Renal Artery Duplex) - Signed 

## 2010-05-11 NOTE — Assessment & Plan Note (Signed)
Summary: flu shot/jhn/cd   Nurse Visit   Allergies: 1)  ! Sulfa 2)  ! * Latex 3)  ! * Dilaudid  Orders Added: 1)  Flu Vaccine 89yrs + MEDICARE PATIENTS [Q2039] 2)  Administration Flu vaccine - MCR [G0008]       Flu Vaccine Consent Questions     Do you have a history of severe allergic reactions to this vaccine? no    Any prior history of allergic reactions to egg and/or gelatin? no    Do you have a sensitivity to the preservative Thimersol? no    Do you have a past history of Guillan-Barre Syndrome? no    Do you currently have an acute febrile illness? no    Have you ever had a severe reaction to latex? no    Vaccine information given and explained to patient? yes    Are you currently pregnant? no    Lot Number:[id number]   Exp Date:10/07/2010   Site Given  Left Deltoid IMu

## 2010-05-11 NOTE — Letter (Signed)
Summary: [facility]   Imported By: [name] 10/12/2009 14:57:13  _____________________________________________________________________  External Attachment:    Type:   Image     Comment:   External Document

## 2010-05-11 NOTE — Miscellaneous (Signed)
Summary: Appointment Canceled  Appointment status changed to canceled by [employer] on 03/21/2010 4:03 PM.  Cancellation Comments --------------------- adenosine/dx:CP/wt:175/ins:SH/Dr JOhn  Appointment Information ----------------------- Appt Type:  CARDIOLOGY NUCLEAR TESTING      Date:  Thursday, March 30, 2010      Time:  8:30 AM for 15 min   Urgency:  Routine   Made By:  Pearson Grippe  To Visit:  [id number]    Reason:  adenosine/dx:CP/wt:175/ins:SH/Dr JOhn  Appt Comments ------------- -- 03/21/10 16:03: ([facility]) CANCELED -- adenosine/dx:CP/wt:175/ins:SH/Dr JOhn -- 03/21/10 15:58: ([facility]) BOOKED -- Routine CARDIOLOGY NUCLEAR TESTING at 03/30/2010 8:30 AM for 15 min adenosine/dx:CP/wt:175/ins:SH/Dr JOhn -- 03/21/10 15:43: ([facility]) BOOKED

## 2010-05-11 NOTE — Progress Notes (Signed)
Summary: Nuclear Pre-Procedure  Phone Note Outgoing Call Call back at Home Phone [phone]   Call placed to: Patient Action Taken: Phone Call Completed Summary of Call: Reviewed information on [employer] Information Sheet (see scanned document for further details).  Spoke with the patient.     Nuclear Med Background Indications for Stress Test: Evaluation for Ischemia   History: Myocardial Perfusion Study  History Comments: '98 MPS  Symptoms: Chest Pain, Fatigue    Nuclear Pre-Procedure Cardiac Risk Factors: Carotid Disease, Lipids, PVD Height (in): 61

## 2010-05-11 NOTE — Progress Notes (Signed)
SummaryPrecious Gilding PA  Phone Note From Pharmacy   Caller: [facility] [phone] Summary of Call: PA request--Atelvia. I called insurance company to find out preferred alternatives, and representative stated that she was unsure but would fax me the PA form. Initial call taken by: Lucious Groves,  June 07, 2009 2:51 PM  Follow-up for Phone Call        form completed and will wait for insurance company reply. Follow-up by: Lucious Groves,  June 07, 2009 3:26 PM     Appended Document: Christina Gilding PA Rec'd fax notifying that Christina [name] is denies, patient must try and fail Alendronate and Boniva or have specific medical reason why those meds cannot be taken. Please advise.  Appended Document: Atelvia PA pt with GI upset with foxamax and boniva  - ok to pursue atelvia  Appended Document: Atelvia PA Called [facility] and notified of the above, PA will be re-submitted for authorization.  Appended Document: Atelvia PA Approved until 2012.

## 2010-05-11 NOTE — Medication Information (Signed)
Summary: Christina [name] Approved/[facility]  Christina [name] Approved/[facility]   Imported By: [name] 06/13/2009 08:58:02  _____________________________________________________________________  External Attachment:    Type:   Image     Comment:   External Document

## 2010-05-11 NOTE — Progress Notes (Signed)
Summary: stress test  Phone Note Call from Patient Call back at Home Phone [phone]   Caller: Patient Summary of Call: Pt called stating she would be unable to due stress test in treadmill JWJ ordered, she has had Polio and is unable to run on treadmill. Pt is requesting alternate testing Initial call taken by: Margaret Pyle, CMA,  March 21, 2010 3:37 PM  Follow-up for Phone Call        pt is correct, I will change to the one that does not require walking on the treadmill Follow-up by: Corwin Levins MD,  March 21, 2010 3:40 PM  Additional Follow-up for Phone Call Additional follow up Details #1::        Pt advised Additional Follow-up by: Margaret Pyle, CMA,  March 21, 2010 3:44 PM

## 2010-05-17 ENCOUNTER — Ambulatory Visit
Admission: RE | Admit: 2010-05-17 | Discharge: 2010-05-17 | Disposition: A | Discharge status: Home / Self Care | Payer: MEDICARE | Source: Ambulatory Visit | Attending: Gynecology | Admitting: Gynecology

## 2010-05-17 DIAGNOSIS — Z1231 Encounter for screening mammogram for malignant neoplasm of breast: Secondary | ICD-10-CM

## 2010-05-17 NOTE — Assessment & Plan Note (Signed)
Summary: yearly f/u exam/ cd   Vital Signs:  Patient profile:   76 year old female Height:      61 inches Weight:      172 pounds BMI:     32.62 O2 Sat:      92 % on Room air Temp:     98.3 degrees F oral Pulse rate:   66 / minute BP sitting:   142 / 72  (left arm) Cuff size:   regular  Vitals Entered By: Zella Ball Ewing CMA Duncan Dull) (May 08, 2010 9:41 AM)  O2 Flow:  Room air  Preventive Care Screening     pt has appt for mammogram feb 9  CC: Yearly/RE   CC:  Yearly/RE.  History of Present Illness: here for welness, and f/u;  had cxr in dec 2011 with CMG only;  BP at home cont's to be largley controlled on current meds;    also had some ? about the crestor - son has unsual vertigo related, and knows of another person with cognitive slowing related  Pt denies CP, worsening sob, doe, wheezing, orthopnea, pnd, worsening LE edema, palps, dizziness or syncope  Pt denies new neuro symptoms such as headache, facial or extremity weakness  Pt denies polydipsia, polyuria, or low sugar symptoms such as shakiness improved with eating.  Overall good compliance with meds, trying to follow low chol diet, wt stable, little excercise however Overall good compliance with meds, and good tolerability.  No fever, wt loss, night sweats, loss of appetite or other constitutional symptoms  .  Denies worsening depressive symptoms, suicidal ideation, or panic.   Pt states good ability with ADL's, low fall risk, home safety reviewed and adequate, no significant change in hearing or vision, trying to follow lower chol diet, and occasionally active only with regular excercise.  Does have ongoing knee pain which limits the excercise.  Has chairlift for the stairs at home installed  Problems Prior to Update: 1)  Vitamin D Deficiency  (ICD-268.9) 2)  Cardiomegaly  (ICD-429.3) 3)  Chest Pain  (ICD-786.50) 4)  Post-polio Syndrome  (ICD-138) 5)  Preventive Health Care  (ICD-V70.0) 6)  Sedimentation Rate, Elevated   (ICD-790.1) 7)  Knee Pain, Right, Acute  (ICD-719.46) 8)  Back Pain  (ICD-724.5) 9)  Headache  (ICD-784.0) 10)  Osteoporosis  (ICD-733.00) 11)  Abdominal Pain, Left Lower Quadrant  (ICD-789.04) 12)  Bursitis, Left Hip  (ICD-726.5) 13)  Other Voice Disturbance  (ICD-784.49) 14)  Allergic Rhinitis  (ICD-477.9) 15)  Hypokalemia  (ICD-276.8) 16)  Gastroenteritis Without Dehydration  (ICD-558.9) 17)  Abdominal Pain, Lower  (ICD-789.09) 18)  Peripheral Vascular Disease  (ICD-443.9) 19)  Hyperlipidemia  (ICD-272.4) 20)  Fatigue  (ICD-780.79) 21)  Post-polio Syndrome  (ICD-138) 22)  Hypercholesterolemia  (ICD-272.0) 23)  Degenerative Joint Disease  (ICD-715.90) 24)  Hx of Baker's Cyst, Right Knee  (ICD-727.51) 25)  Osteopenia  (ICD-733.90) 26)  Hypertension  (ICD-401.9) 27)  Gerd  (ICD-530.81) 28)  Anxiety  (ICD-300.00)  Medications Prior to Update: 1)  Crestor 20 Mg Tabs (Rosuvastatin Calcium) .Marland Kitchen.. 1 By Mouth Once Daily 2)  Diovan 320 Mg Tabs (Valsartan) .... Take 1 Tablet By Mouth Once A Day 3)  Ecotrin Low Strength 81 Mg  Tbec (Aspirin) .Marland Kitchen.. 1 By Mouth Qd 4)  Nexium 40 Mg  Cpdr (Esomeprazole Magnesium) .... Take 1 By Mouth Once Daily 5)  Adult Aspirin Ec Low Strength 81 Mg Tbec (Aspirin) .Marland Kitchen.. 1po Once Daily 6)  Estrace 0.1 Mg/gm Crea (Estradiol) .Marland KitchenMarland KitchenMarland Kitchen  Apply Small Amount To Inner Thigh 2- 3 Times A Week 7)  First-Testosterone Mc 2 % Crea (Testosterone Propionate) .... Apply Small Amount To Inner Thigh 3-4 Times A Week 8)  Calcium 1200mg  .... 1 By Mouth Once Daily 9)  Vitamin D 2000 Unit Tabs (Cholecalciferol) .Marland Kitchen.. 1 By Mouth Once Daily 10)  Atelvia 35 Mg .Marland KitchenMarland KitchenMarland Kitchen 1po Q Wk  Current Medications (verified): 1)  Crestor 20 Mg Tabs (Rosuvastatin Calcium) .Marland Kitchen.. 1 By Mouth Once Daily 2)  Diovan 320 Mg Tabs (Valsartan) .... Take 1 Tablet By Mouth Once A Day 3)  Ecotrin Low Strength 81 Mg  Tbec (Aspirin) .Marland Kitchen.. 1 By Mouth Qd 4)  Nexium 40 Mg  Cpdr (Esomeprazole Magnesium) .... Take 1 By Mouth Once  Daily 5)  Adult Aspirin Ec Low Strength 81 Mg Tbec (Aspirin) .Marland Kitchen.. 1po Once Daily 6)  Estrace 0.1 Mg/gm Crea (Estradiol) .... Apply Small Amount To Inner Thigh 2- 3 Times A Week 7)  First-Testosterone Mc 2 % Crea (Testosterone Propionate) .... Apply Small Amount To Inner Thigh 3-4 Times A Week 8)  Calcium 1200mg  .... 1 By Mouth Once Daily 9)  Vitamin D 2000 Unit Tabs (Cholecalciferol) .Marland Kitchen.. 1 By Mouth Once Daily 10)  Atelvia 35 Mg .Marland KitchenMarland KitchenMarland Kitchen 1po Q Wk  Allergies (verified): 1)  ! Sulfa 2)  ! * Latex 3)  ! * Dilaudid  Past History:  Past Surgical History: Last updated: 10/22/2008 s/p right first finger trigger finger s/p right CEA fe b 2010 - dr Hart [place]  Family History: Last updated: 04/16/2007 heart disease  Social History: Last updated: 03/20/2010 Never Smoked Alcohol use-no Married 2 children retired - teaching special educatoin Drug use-no  Risk Factors: Smoking Status: never (04/16/2007)  Past Medical History: Hyperlipidemia Hypertension post-polio syndrome Peripheral vascular disease  - carotid Anxiety Osteopenia GERD bilat knee djd - with recurring pain palpitations Allergic rhinitis Osteoporosis - per GYN vit d deficiency  Review of Systems  The patient denies anorexia, fever, vision loss, decreased hearing, chest pain, syncope, dyspnea on exertion, peripheral edema, prolonged cough, headaches, hemoptysis, abdominal pain, melena, hematochezia, severe indigestion/heartburn, hematuria, muscle weakness, suspicious skin lesions, transient blindness, depression, unusual weight change, abnormal bleeding, enlarged lymph nodes, and angioedema.         all otherwise negative per pt -    Physical Exam  General:  alert and overweight-appearing.   Head:  normocephalic and atraumatic.   Eyes:  vision grossly intact, pupils equal, and pupils round.   Ears:  R ear normal and L ear normal.   Nose:  no external deformity and no nasal discharge.   Mouth:  no gingival  abnormalities and pharynx pink and moist.   Neck:  supple and no masses.   Lungs:  normal respiratory effort and normal breath sounds.   Heart:  normal rate and regular rhythm.   Abdomen:  soft, non-tender, and normal bowel sounds.   Msk:  no joint tenderness and no joint swelling.  , no chest wall tender Extremities:  no edema, no erythema  Neurologic:  cranial nerves II-XII intact, strength normal in all extremities, and sensation intact to light touch. except for right quad /thigh weakness, and chronic LLE weakness diffusely   Skin:  no scalp or temple area tenderness Psych:  not depressed appearing and slightly anxious.     Impression & Recommendations:  Problem # 1:  Preventive Health Care (ICD-V70.0)  Overall doing well, age appropriate education and counseling updated, referral for preventive services and immunizations addressed, dietary  counseling and smoking status adressed , most recent labs reviewed I have personally reviewed and have noted 1.The patient's medical and social history 2.Their use of alcohol, tobacco or illicit drugs 3.Their current medications and supplements 4. Functional ability including ADL's, fall risk, home safety risk, hearing & visual impairment  5.Diet and physical activities 6.Evidence for depression or mood disorders The patients weight, height, BMI  have been recorded in the chart I have made referrals, counseling and provided education to the patient based review of the above   Orders: TLB-BMP (Basic Metabolic Panel-BMET) (80048-METABOL) TLB-CBC Platelet - w/Differential (85025-CBCD) TLB-Hepatic/Liver Function Pnl (80076-HEPATIC) TLB-Lipid Panel (80061-LIPID) TLB-TSH (Thyroid Stimulating Hormone) (84443-TSH) TLB-Udip ONLY (81003-UDIP)  Problem # 2:  CARDIOMEGALY (ICD-429.3) by cxr - asympt - for echpo to r/po aortic issues , as BP is Ok   Orders: Echo Referral (Echo)  Problem # 3:  VITAMIN D DEFICIENCY (ICD-268.9) currently on the vit d  - to check today  Problem # 4:  HYPERTENSION (ICD-401.9)  Her updated medication list for this problem includes:    Diovan 320 Mg Tabs (Valsartan) .Marland Kitchen... Take 1 tablet by mouth once a day  BP today: 142/72 Prior BP: 152/80 (03/20/2010)  Labs Reviewed: K+: 3.8 (05/03/2009) Creat: : 0.7 (05/03/2009)   Chol: 128 (05/03/2009)   HDL: 59.10 (05/03/2009)   LDL: 56 (05/03/2009)   TG: 66.0 (05/03/2009) stable overall by hx and exam, ok to continue meds/tx as is   Problem # 5:  HYPERCHOLESTEROLEMIA (ICD-272.0)  Her updated medication list for this problem includes:    Crestor 20 Mg Tabs (Rosuvastatin calcium) .Marland Kitchen... 1 by mouth once daily  Labs Reviewed: SGOT: 19 (05/03/2009)   SGPT: 15 (05/03/2009)   HDL:59.10 (05/03/2009), 55.4 (04/28/2008)  LDL:56 (05/03/2009), 55 (04/28/2008)  Chol:128 (05/03/2009), 124 (04/28/2008)  Trig:66.0 (05/03/2009), 66 (04/28/2008) stable overall by hx and exam, ok to continue meds/tx as is  - Pt to continue diet efforts, good med tolerance; to check labs - goal LDL less than 70   Complete Medication List: 1)  Crestor 20 Mg Tabs (Rosuvastatin calcium) .Marland Kitchen.. 1 by mouth once daily 2)  Diovan 320 Mg Tabs (Valsartan) .... Take 1 tablet by mouth once a day 3)  Ecotrin Low Strength 81 Mg Tbec (Aspirin) .Marland Kitchen.. 1 by mouth qd 4)  Nexium 40 Mg Cpdr (Esomeprazole magnesium) .... Take 1 by mouth once daily 5)  Adult Aspirin Ec Low Strength 81 Mg Tbec (Aspirin) .Marland Kitchen.. 1po once daily 6)  Estrace 0.1 Mg/gm Crea (Estradiol) .... Apply small amount to inner thigh 2- 3 times a week 7)  First-testosterone Mc 2 % Crea (Testosterone propionate) .... Apply small amount to inner thigh 3-4 times a week 8)  Calcium 1200mg   .... 1 by mouth once daily 9)  Vitamin D 2000 Unit Tabs (Cholecalciferol) .Marland Kitchen.. 1 by mouth once daily 10)  Atelvia 35 Mg  .Marland KitchenMarland KitchenMarland Kitchen 1po q wk  Other Orders: T-Vitamin D (25-Hydroxy) [id number])  Patient Instructions: 1)  You will be contacted about the referral(s) to:  echocardiogram 2)  Please go to the Lab in the basement for your blood and/or urine tests today 3)  Please call the number on the [facility] Card for results of your testing 4)  Please keep your appts fo rthe mammogram and the carotid arteries 5)  Please schedule a follow-up appointment in 1 year or sooner if needed Prescriptions: CRESTOR 20 MG TABS (ROSUVASTATIN CALCIUM) 1 by mouth once daily  #90 x 3   Entered and Authorized by:  Corwin Levins MD   Signed by:   Corwin Levins MD on 05/08/2010   Method used:   Print then Give to Patient   RxID:   [id number] DIOVAN 320 MG TABS (VALSARTAN) Take 1 tablet by mouth once a day  #90 x 3   Entered and Authorized by:   Corwin Levins MD   Signed by:   Corwin Levins MD on 05/08/2010   Method used:   Print then Give to Patient   RxID:   [id number] NEXIUM 40 MG  CPDR (ESOMEPRAZOLE MAGNESIUM) TAKE 1 by mouth once daily  #90 x 3   Entered and Authorized by:   Corwin Levins MD   Signed by:   Corwin Levins MD on 05/08/2010   Method used:   Print then Give to Patient   RxID:   [id number]    Orders Added: 1)  Echo Referral [Echo] 2)  T-Vitamin D (25-Hydroxy) [01027-25366] 3)  TLB-BMP (Basic Metabolic Panel-BMET) [80048-METABOL] 4)  TLB-CBC Platelet - w/Differential [85025-CBCD] 5)  TLB-Hepatic/Liver Function Pnl [80076-HEPATIC] 6)  TLB-Lipid Panel [80061-LIPID] 7)  TLB-TSH (Thyroid Stimulating Hormone) [84443-TSH] 8)  TLB-Udip ONLY [81003-UDIP] 9)  Est. Patient 65& > [44034]

## 2010-06-02 ENCOUNTER — Other Ambulatory Visit (INDEPENDENT_AMBULATORY_CARE_PROVIDER_SITE_OTHER): Payer: MEDICARE

## 2010-06-02 DIAGNOSIS — I6529 Occlusion and stenosis of unspecified carotid artery: Secondary | ICD-10-CM

## 2010-06-02 DIAGNOSIS — Z48812 Encounter for surgical aftercare following surgery on the circulatory system: Secondary | ICD-10-CM

## 2010-06-08 NOTE — Procedures (Unsigned)
CAROTID DUPLEX EXAM  INDICATION:  Followup carotid artery disease.  HISTORY: Diabetes:  No. Cardiac:  No. Hypertension:  No. Smoking:  No. Previous Surgery:  Right carotid endarterectomy on 05/26/2008. CV History:  The patient is currently asymptomatic. Amaurosis Fugax No, Paresthesias No, Hemiparesis No                                      RIGHT             LEFT Brachial systolic pressure:         146               144 Brachial Doppler waveforms:         WNL               WNL Vertebral direction of flow:        Antegrade         Antegrade DUPLEX VELOCITIES (cm/sec) CCA peak systolic                   96                83 ECA peak systolic                   68                95 ICA peak systolic                   90                88 ICA end diastolic                   31                31 PLAQUE MORPHOLOGY:                                    Heterogeneous PLAQUE AMOUNT:                                        Mild PLAQUE LOCATION:                                      ICA and ECA  IMPRESSION:  Bilaterally no hemodynamically significant stenosis. Patent right carotid endarterectomy.  Study stable compared to previous.  ___________________________________________ [name], M.D.  OD/MEDQ  D:  06/02/2010  T:  06/02/2010  Job:  [id number]

## 2010-06-20 ENCOUNTER — Other Ambulatory Visit: Payer: Self-pay | Admitting: Gynecology

## 2010-07-25 LAB — BASIC METABOLIC PANEL
CO2: 25 mEq/L (ref 19–32)
Chloride: 104 mEq/L (ref 96–112)
Creatinine, Ser: 0.53 mg/dL (ref 0.4–1.2)
GFR calc Af Amer: >60 mL/min (ref >60)
Potassium: 3.4 mEq/L — ABNORMAL LOW (ref 3.5–5.1)

## 2010-07-25 LAB — CROSSMATCH

## 2010-07-25 LAB — COMPREHENSIVE METABOLIC PANEL
Alkaline Phosphatase: 53 U/L (ref 39–117)
BUN: 13 mg/dL (ref 6–23)
Chloride: 104 mEq/L (ref 96–112)
GFR calc non Af Amer: >60 mL/min (ref >60)
Glucose, Bld: 94 mg/dL (ref 70–99)
Potassium: 4.2 mEq/L (ref 3.5–5.1)
Total Bilirubin: 0.6 mg/dL (ref 0.3–1.2)

## 2010-07-25 LAB — CBC
HCT: 36.9 % (ref 36.0–46.0)
HCT: 29.5 % — ABNORMAL LOW (ref 36.0–46.0)
Hemoglobin: 12.9 g/dL (ref 12.0–15.0)
MCHC: 34.5 g/dL (ref 30.0–36.0)
MCV: 91.3 fL (ref 78.0–100.0)
RBC: 3.23 MIL/uL — ABNORMAL LOW (ref 3.87–5.11)
RDW: 13.9 % (ref 11.5–15.5)
WBC: 6.2 10*3/uL (ref 4.0–10.5)
WBC: 7.3 10*3/uL (ref 4.0–10.5)

## 2010-07-25 LAB — URINALYSIS, ROUTINE W REFLEX MICROSCOPIC
Bilirubin Urine: NEGATIVE
Hgb urine dipstick: NEGATIVE
Protein, ur: NEGATIVE mg/dL
Urobilinogen, UA: 0.2 mg/dL (ref 0.0–1.0)

## 2010-07-25 LAB — PROTIME-INR
INR: 0.9 (ref 0.00–1.49)
Prothrombin Time: 12.7 seconds (ref 11.6–15.2)

## 2010-07-25 LAB — ABO/RH: ABO/RH(D): A POS

## 2010-08-22 NOTE — Discharge Summary (Signed)
NAME:  Christina [name], Christina [name] NO.:  [id number]   MEDICAL RECORD NO.:  [id number]          PATIENT TYPE:  INP   LOCATION:  3302                         FACILITY:  [facility]   PHYSICIAN:  Quita Skye. Hart Rochester, M.D.  DATE OF BIRTH:  [birth year]   DATE OF ADMISSION:  05/26/2008  DATE OF DISCHARGE:  05/28/2008                               DISCHARGE SUMMARY   ADMISSION DIAGNOSIS:  Severe right internal carotid artery stenosis,  asymptomatic.   FINAL DISCHARGE DIAGNOSES:  1. Severe right internal carotid artery stenosis, asymptomatic status      post right carotid endarterectomy.  2. Hypertension.  3. Hyperlipidemia.  4. Mild postoperative hypokalemia, supplemented.  5. History of polio at age 85.  75. Latex allergy.  7. Polio surgeries x2.  8. Tonsillectomy at age 69, which she had difficulty awakening from      anesthesia.  9. Latex allergy and also to sulfa drugs and potential severe reaction      during surgery to Dilaudid and unsure that she may have that      reaction with scopolamine and Zenithal.   PROCEDURE:  May 26, 2008, right carotid endarterectomy with Dacron  patch angioplasty by Dr. Josephina Gip.   BRIEF HISTORY:  Christina [name] is a 76 year old Caucasian female referred by  Dr. Oliver Barre for carotid occlusive disease, which was discovered by  [facility] Screening in December 2009.  Duplex scan revealed 80%-90%  right internal carotid artery stenosis.  There was minimal flow  reduction on the left side.  Dr. Hart Rochester recommended elective right  carotid endarterectomy to reduce the risk for future stroke.   HOSPITAL COURSE:  [name] was electively admitted to [facility] on May 26, 2008.  She underwent the previously mentioned  procedure.  Fortunately, she had no difficulty awaking from surgery and  neurologically intact.  At the time of this dictation, she is remained  stable throughout her hospital course, although with some intermittent  hypertension with systolic up to the 170s.  Her home medications have  been resumed and labetalol and hydralazine p.r.n. were also in for  systolic blood pressure greater than 170.  She has been maintaining  sinus rhythm and oxygen saturations have been greater than 90% on room  air.  Morning labs showed a white count 7.3, hemoglobin 10.2, hematocrit  29.5, and platelet count 143.  Sodium 137, potassium 3.4, which was  supplemented, BUN is 6, creatinine 0.53, and blood glucose of 96.  She  has had no dysphagia and has been able to walk, although she does report  that she feels a little weak or she believes that this is related to her  history of polio.  There was no feeling of lightheadedness or dizziness.  On physical exam, she is neurologically intact.  Her tongue is midline.  She is moving all extremities strong and symmetrically.  Her incision is  clean, dry, and intact without evidence of hematoma.  At this point, we  will advance her diet to regular diet and increase her mobility.  If she  tolerates regular food and still  stronger by the afternoon, we  anticipate that she can be discharged home on the afternoon of postop  day 1, otherwise we will keep her an additional day.  Currently, she  remains in stable condition.   DISCHARGE MEDICATIONS:  1. Crestor 20 mg p.o. at bedtime.  2. Diovan 320 mg one half tablet at bedtime.  3. Ecotrin 81 mg p.o. daily.  4. Nexium 40 mg b.i.d.  5. Actonel 150 mg monthly.  6. Calcium 600 mg with vitamin D b.i.d.  7. Vitamin D3 gel-cap daily.  8. Fish oil capsule 1000 mg p.o. daily.  9. Cetalox 1 tablet p.o. q.4 h. p.r.n. pain.   DISCHARGE INSTRUCTIONS:  She is to continue a heart-healthy diet.  Increase her activity as tolerated.  Avoid driving or heaving lifting  for the next 2 weeks.  May shower and clean incisions gently with soap  and water.  Call if she has fever greater than 101, redness or drainage  from her incision site or persisted  severe headaches or neurologic  changes.  She will see Dr. Hart Rochester in 2-3 weeks.  She should call sooner  if needed.      Jerold Coombe, P.A.      [name], M.D.  Electronically Signed    AWZ/MEDQ  D:  05/27/2008  T:  05/27/2008  Job:  [id number]   cc:   Corwin Levins, MD

## 2010-08-22 NOTE — Op Note (Signed)
NAME:  SYLENA, LOTTER NO.:  [id number]   MEDICAL RECORD NO.:  [id number]          PATIENT TYPE:  INP   LOCATION:  3302                         FACILITY:  [facility]   PHYSICIAN:  Quita Skye. Hart Rochester, M.D.  DATE OF BIRTH:  23-Nov-1934   DATE OF PROCEDURE:  05/26/2008  DATE OF DISCHARGE:  03/19/2008                               OPERATIVE REPORT   PREOPERATIVE DIAGNOSIS:  Severe right internal carotid stenosis -  asymptomatic.   POSTOPERATIVE DIAGNOSIS:  Severe right internal carotid stenosis -  asymptomatic.   OPERATION:  Right carotid endarterectomy with Dacron patch angioplasty.   SURGEON:  Quita Skye. Hart Rochester, MD   FIRST ASSISTANT:  Jerold Coombe, PA   ANESTHESIA:  General endotracheal.   BRIEF HISTORY:  This patient was referred by Dr. Oliver Barre for carotid  occlusive disease which was discovered by [facility] screening in December  2009.  Duplex scan revealed an 80-90% right internal carotid stenosis.  Minimal flow reduction on the left side.  She was scheduled for a  endarterectomy for this severe lesion which was asymptomatic.   PROCEDURE:  The patient was taken to the operating room, placed in the  supine position at which time satisfactory general endotracheal  anesthesia was administered.  Right neck was prepped with Betadine scrub  and solution draped in routine sterile manner.  Incision was made along  the anterior border of the sternocleidomastoid muscle and carried down  through the subcutaneous tissue and platysma using the Bovie.  Common  facial vein and external jugular veins were ligated with 3-0 silk ties  and divided exposing the common internal and external carotid arteries.  Care was taken not to injure the vagus or hypoglossal nerves both of  which were exposed.  There was calcified atherosclerotic plaque at the  carotid bifurcation extending up the internal carotid about 3 cm  posteriorly.  Distal vessel appeared normal.  A #10 shunt was  prepared  and the patient was heparinized.  The carotid vessels were occluded with  vascular clamps and longitudinal opening made in the common carotid with  a 15 blade extending up the internal carotid with Potts scissors to a  point distal to the disease.  The plaque appeared to be 80-90% stenotic  in severity with mildly irregular and the distal vessel appeared normal.  The #10 shunt was inserted without difficulty reestablishing flow in  about 2 minutes.  A standard endarterectomy was then performed using the  elevator and Potts scissors with eversion endarterectomy of the external  carotid.  The plaque feathered off the distal internal carotid artery  nicely not requiring any tacking sutures.  Lumen was thoroughly  irrigated with heparin saline.  All loose debris carefully removed and  arteriotomy was closed with a patch using continuous 6-0 Prolene.  Prior  to completion of the closure, the shunt was removed after about 30  minutes of shunt time.  Following antegrade and retrograde flushing,  closure was completed with reestablishment of flow initially up the  external and up the internal  branch.  Carotid was occluded for less than 2 minutes  for removal of the  shunt.  Protamine was then given to reverse the heparin.  Following  adequate hemostasis, the wound was irrigated with saline, closed in  layers with Vicryl in a subcuticular fashion.  Sterile dressing applied.  The patient taken to recovery room in satisfactory condition.      [name], M.D.  Electronically Signed     JDL/MEDQ  D:  05/26/2008  T:  05/26/2008  Job:  [id number]

## 2010-08-22 NOTE — Procedures (Signed)
CAROTID DUPLEX EXAM   INDICATION:  Follow up carotid artery disease.   HISTORY:  Diabetes:  No.  Cardiac:  No.  Hypertension:  Yes.  Smoking:  No.  Previous Surgery:  Right CEA with DPA 05/26/2008.  CV History:  No.  Amaurosis Fugax No, Paresthesias No, Hemiparesis No                                       RIGHT             LEFT  Brachial systolic pressure:         164               166  Brachial Doppler waveforms:         WNL               WNL  Vertebral direction of flow:        Antegrade         Antegrade  DUPLEX VELOCITIES (cm/sec)  CCA peak systolic                   83                91  ECA peak systolic                   118               104  ICA peak systolic                   130               108  ICA end diastolic                   43                34  PLAQUE MORPHOLOGY:                  N/A               Heterogeneous  PLAQUE AMOUNT:                      N/A               Mild  PLAQUE LOCATION:                    N/A               ICA/bifurcation   IMPRESSION:  1. Right ICA velocities are suggestive of 40% to 59% stenosis (low end      of range) distal to patch, however may be elevated due to diameter      change.  2. Left ICA shows evidence of 20% to 39% stenosis.   ___________________________________________  [name], M.D.   AS/MEDQ  D:  12/07/2008  T:  12/07/2008  Job:  [id number]

## 2010-08-22 NOTE — Procedures (Signed)
CAROTID DUPLEX EXAM   INDICATION:  Follow up carotid artery disease.   HISTORY:  Diabetes:  No.  Cardiac:  No.  Hypertension:  No.  Smoking:  No.  Previous Surgery:  Right carotid endarterectomy with DPA on 05/26/08.  CV History:  No.  Amaurosis Fugax No, Paresthesias No, Hemiparesis No.                                       RIGHT             LEFT  Brachial systolic pressure:         158               154  Brachial Doppler waveforms:         WNL               WNL  Vertebral direction of flow:        Antegrade         Antegrade  DUPLEX VELOCITIES (cm/sec)  CCA peak systolic                   108               114  ECA peak systolic                   117               98  ICA peak systolic                   125               114  ICA end diastolic                   36                36  PLAQUE MORPHOLOGY:                  Homogenous        Heterogenous  PLAQUE AMOUNT:                      Mild              Mild  PLAQUE LOCATION:                    ICA               ICA, bifurcation   IMPRESSION:  1. Right internal carotid artery suggests 20-39% stenosis, status post      carotid endarterectomy.  2. Left internal carotid artery suggests 20-39% stenosis.  3. Antegrade flow in bilateral vertebrals.   ___________________________________________  [name], M.D.   CB/MEDQ  D:  05/31/2009  T:  06/01/2009  Job:  [id number]

## 2010-08-22 NOTE — H&P (Signed)
HISTORY AND PHYSICAL EXAMINATION   May 10, 2008   Re:  Christina Christina [name], Christina Christina [name]              DOB:  10/02/1934   CHIEF COMPLAINT:  Severe right internal carotid stenosis - asymptomatic.   HISTORY OF PRESENT ILLNESS:  This is Christina [name] 76 year old female patient  referred by Dr. Oliver [name] for severe right carotid occlusive disease.  The patient underwent [facility] screening in December 2009 and was found  to have severe right internal carotid stenosis.  She denies any symptoms  of hemiparesis, aphasia, amaurosis fugax, diplopia, blurred vision,  syncope, or previous stroke.  Christina [name] second carotid duplex was done at  [facility] on January 26 which confirmed the above findings with  an 80-90% right internal carotid stenosis and mild left internal carotid  occlusive disease.   PAST MEDICAL HISTORY:  1. Hypertension.  2. Hyperlipidemia.  3. History of polio at age 67.  4. Negative for diabetes, coronary artery disease, COPD, or stroke.  5. Latex allergy.   PREVIOUS SURGERIES:  1. Post polio surgery x2.  2. Tonsillectomy at age 14.  The patient had some anesthesia issues      due to unknown reasons, but she will discuss this further with the      anesthesiologist.   FAMILY HISTORY:  Positive for stroke in her mother and father.  Coronary  artery disease in Christina [name] maternal uncle.  Negative for diabetes.   SOCIAL HISTORY:  She is married and has 2 children, is Christina [name] retired  Runner, broadcasting/film/video.  She does not use tobacco and has never.  Does drink alcohol  occasionally.   REVIEW OF SYSTEMS:  Positive for weight gain, heart murmur, reflux  esophagitis, occasional dysphagia, muscle discomfort, and continued  weakness related to her polio.  Occasional radiculopathy in the left  upper extremity with some discomfort and tingling in the left fingers.  Some neck discomfort.   ALLERGIES:  1. Latex.  2. Sulfa drugs.  3. Potential severe reaction during surgery to Dilaudid.  4. Questionable  scopolamine.  5. Possible zenithal.   MEDICATIONS:  1. Crestor 20 mg daily.  2. Diovan 320 mg daily.  3. Ecotrin 81 mg daily.  4. Nexium 40 mg b.i.d.  5. Actonel 150 mg tablets once daily.   PHYSICAL EXAM:  Blood pressure 163/88, heart rate is 73, respirations  16.  Generally, she is Christina [name] healthy-appearing female in no apparent  distress.  Alert and oriented x3.  Neck is supple with 3+ carotid  pulses.  There is Christina [name] harsh bruit on the right side.  No bruit on the  left.  Neurologic exam is normal.  No palpable adenopathy in the neck.  Chest is clear to auscultation.  Cardiovascular exam is Christina [name] regular rhythm  with no murmurs.  Abdomen is soft and nontender with no masses.  She has  3+ femoral, popliteal, and 2+ dorsalis pedis pulses bilaterally.   IMPRESSION:  1. Severe right internal carotid stenosis - asymptomatic.  2. Hypertension.  3. Hyperlipidemia.  4. History of polio as Christina [name] child.  5. Probable latex allergy.   PLAN:  To admit the patient on February 17 for an elective right carotid  endarterectomy.  The risks and benefits have been thoroughly discussed  with the patient and she would like to proceed.   Christina Christina [name], M.D.  Electronically Signed   JDL/MEDQ  D:  05/10/2008  T:  05/11/2008  Job:  [id number]   cc:   Christina Christina [name]  Christina Mayhew, MD

## 2010-08-22 NOTE — Assessment & Plan Note (Signed)
OFFICE VISIT   Christina [name], Christina [name] A  DOB:  29-Jan-1935                                       06/08/2008  EAVWU#:[id number]   The patient underwent a right carotid endarterectomy with Dacron patch  angioplasty by me on February 17 for severe right internal carotid  stenosis which is asymptomatic and picked up on [facility] screening.  She  has done very well and has had no neurologic complications or specific  complaints.  She is swallowing well and has no hoarseness.  Denies any  hemiparesis, aphasia, amaurosis fugax, diplopia, blurred vision or  syncope.  She is taking one 81 mg aspirin tablet per day.   PHYSICAL EXAM:  Today blood pressure 159/82, heart rate 70, respirations  14.  Right neck incision has healed nicely.  The carotid pulses are 3+  and no audible bruits.  Neurological:  Normal with the exception of her  post polio changes in the lower extremities.   In general I think she has done quite well.  She will continue to  increase her activity as tolerated.  I plan to see her in 6 months with  followup carotid duplex exam at that time unless she has any problems in  the interim.   [name], M.D.  Electronically Signed   JDL/MEDQ  D:  06/08/2008  T:  06/09/2008  Job:  [id number]   cc:   Corwin Levins, MD

## 2010-08-22 NOTE — Assessment & Plan Note (Signed)
OFFICE VISIT   [name], [name] A  DOB:  03/16/1935                                       12/07/2008  ZOXWR#:[id number]   The patient returns for initial followup regarding right carotid  endarterectomy done in February of this year for severe right internal  carotid stenosis which was asymptomatic and discovered by [facility]  screening in December of 2009.  She had an 80-90% stenosis with minimal  flow reduction on the left side.  She has been free of any neurologic  symptoms since her surgery, denying any hemiparesis, aphasia, amaurosis  fugax, diplopia, blurred vision or syncope.  She does take 1 aspirin per  day.  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  She also denies any cardiac symptoms.   PHYSICAL EXAMINATION:  On physical exam today blood pressure is 139/82,  heart rate 68, respirations 14.  Right neck incision is well-healed.  Carotid pulses are 3+ with normal neurologic exam.  Upper extremity  pulses are 3+ bilaterally.  Abdomen soft, nontender with no masses.  She  has 3+ femoral pulses bilaterally and well-perfused lower extremities.   Carotid duplex exam today reveals mild flow reduction both internal  carotids which is not of any significance at this time.  She was  reassured regarding these findings and will return in 6 months on the  carotid followup protocol unless she develops any symptoms in the  interim.   [name], M.D.  Electronically Signed   JDL/MEDQ  D:  12/07/2008  T:  12/08/2008  Job:  [id number]

## 2010-08-23 ENCOUNTER — Ambulatory Visit (INDEPENDENT_AMBULATORY_CARE_PROVIDER_SITE_OTHER): Payer: Medicare Other | Admitting: Internal Medicine

## 2010-08-23 ENCOUNTER — Encounter: Payer: Self-pay | Admitting: Internal Medicine

## 2010-08-23 ENCOUNTER — Other Ambulatory Visit (INDEPENDENT_AMBULATORY_CARE_PROVIDER_SITE_OTHER): Payer: Medicare Other

## 2010-08-23 VITALS — BP 152/90 | HR 77 | Temp 99.9°F | Ht 61.0 in | Wt 174.0 lb

## 2010-08-23 DIAGNOSIS — R1032 Left lower quadrant pain: Secondary | ICD-10-CM

## 2010-08-23 DIAGNOSIS — R350 Frequency of micturition: Secondary | ICD-10-CM

## 2010-08-23 DIAGNOSIS — Z0000 Encounter for general adult medical examination without abnormal findings: Secondary | ICD-10-CM | POA: Insufficient documentation

## 2010-08-23 DIAGNOSIS — I10 Essential (primary) hypertension: Secondary | ICD-10-CM

## 2010-08-23 DIAGNOSIS — R300 Dysuria: Secondary | ICD-10-CM

## 2010-08-23 LAB — POCT URINALYSIS DIPSTICK
Bilirubin, UA: NEGATIVE
Glucose, UA: NEGATIVE
Ketones, UA: NEGATIVE
Spec Grav, UA: 1.01

## 2010-08-23 LAB — CBC WITH DIFFERENTIAL/PLATELET
Basophils Absolute: 0 10*3/uL (ref 0.0–0.1)
Basophils Relative: 0.3 % (ref 0.0–3.0)
Eosinophils Relative: 0.5 % (ref 0.0–5.0)
HCT: 37 % (ref 36.0–46.0)
Hemoglobin: 12.6 g/dL (ref 12.0–15.0)
Lymphocytes Relative: 10.3 % — ABNORMAL LOW (ref 12.0–46.0)
Lymphs Abs: 1.3 10*3/uL (ref 0.7–4.0)
Monocytes Relative: 7.3 % (ref 3.0–12.0)
Neutro Abs: 9.9 10*3/uL — ABNORMAL HIGH (ref 1.4–7.7)
RBC: 4.01 Mil/uL (ref 3.87–5.11)
RDW: 14.3 % (ref 11.5–14.6)
WBC: 12.2 10*3/uL — ABNORMAL HIGH (ref 4.5–10.5)

## 2010-08-23 LAB — BASIC METABOLIC PANEL
CO2: 29 mEq/L (ref 19–32)
Chloride: 103 mEq/L (ref 96–112)
Creatinine, Ser: 0.6 mg/dL (ref 0.4–1.2)
Glucose, Bld: 87 mg/dL (ref 70–99)

## 2010-08-23 MED ORDER — CIPROFLOXACIN HCL 500 MG PO TABS: 500.0000 mg | ORAL_TABLET | Freq: Two times a day (BID) | ORAL | Status: AC

## 2010-08-23 MED ORDER — METRONIDAZOLE 250 MG PO TABS: 500.0000 mg | ORAL_TABLET | Freq: Three times a day (TID) | ORAL | Status: DC

## 2010-08-23 NOTE — Patient Instructions (Signed)
Take all new medications as prescribed Continue all other medications as before Please go to LAB in the Basement for the blood and/or urine tests to be done today Your urine will be sent for culture Please call the phone number [phone] (the PhoneTree System) for results of testing in 2-3 days;  When calling, simply dial the number, and when prompted enter the MRN number above (the Medical Record Number) and the # key, then the message should start. If the urine culture is negative, we can assume you most likely had Diverticulitis, and you may need followup with GI sooner than 2013 to consider repeat colonoscopy

## 2010-08-24 ENCOUNTER — Telehealth: Payer: Self-pay

## 2010-08-24 NOTE — Telephone Encounter (Signed)
[facility] Triage Call Report Triage Record Num: [id number] Operator: Peri Jefferson Patient Name: Christina [name] Call Date & Time: 08/23/2010 5:17:17PM Patient Phone: [phone] PCP: Oliver Barre Patient Gender: Female PCP Fax : [phone] Patient DOB: [birth year] Practice Name: [facility] Reason for Call: Bobby/[facility] [phone] calling because script was sent to pharmacy for Metronidazole 250 mg take 2 tabs 500mg  total po TID Qty: 21. Does not specify how many days. States that qty would provide 3 days and 1/2 dose of medication. Requesting clarification. Called Dr. Lovell Sheehan VO: Metronidazole 500mg  BID x 7 days #14. Informed Pharm D. Protocol(s) Used: Office Note Recommended Outcome per Protocol: Information Noted and Sent to Office Reason for Outcome: Caller information to office Care Advice: ~ 08/23/2010 5:35:41PM Page 1 of 1 [email or web address]

## 2010-08-25 ENCOUNTER — Other Ambulatory Visit: Payer: Self-pay | Admitting: Internal Medicine

## 2010-08-25 DIAGNOSIS — R1032 Left lower quadrant pain: Secondary | ICD-10-CM

## 2010-08-25 LAB — URINE CULTURE
Colony Count: NO GROWTH
Organism ID, Bacteria: NO GROWTH

## 2010-08-25 NOTE — Op Note (Signed)
NAME:  Christina [name], Christina [name]              ACCOUNT NO.:  [id number]   MEDICAL RECORD NO.:  [id number]          PATIENT TYPE:  AMB   LOCATION:  DSC                          FACILITY:  [facility]   PHYSICIAN:  Katy Fitch. Sypher, M.D. DATE OF BIRTH:  08-07-1934   DATE OF PROCEDURE:  06/07/2005  DATE OF DISCHARGE:                                 OPERATIVE REPORT   PREOPERATIVE DIAGNOSES:  1.  Chronic stenosing tenosynovitis, right index finger at A1 pulley.  2.  Chronic mucous cyst, right index finger distal interphalangeal joint,      with considerable degenerative arthritis at distal interphalangeal      articulation, with large dorsal radial osteophyte at base of distal      phalanx.   POSTOPERATIVE DIAGNOSES:  1.  Chronic stenosing tenosynovitis, right index finger at A1 pulley.  2.  Chronic mucous cyst, right index finger distal interphalangeal joint,      with considerable degenerative arthritis at distal interphalangeal      articulation, with large dorsal radial osteophyte at base of distal      phalanx.   OPERATION:  1.  Release of right index finger A1 pulley with synovectomy of      superficialis tendon.  2.  Distal interphalangeal joint debridement with removal of marginal      osteophytes from distal and middle phalanges, dorsal radial aspect, and      irrigation with a 17-gauge blunt dental needle followed by debridement      of mucous cyst from dorsal nail fold.   OPERATING SURGEON:  Katy Fitch. Sypher, M.D.   ASSISTANT:  Molly Maduro Dasnoit PA-C   ANESTHESIA:  Marcaine 0.25% and 2% lidocaine metacarpal head-level block of  right index finger supplemented by IV sedation, supervising anesthesiologist  is Dr. Noreene Larsson.   INDICATIONS:  Christina [name] is a 76 year old woman referred through the  courtesy of Dr. Oliver Barre for evaluation and management of right hand  symptoms.  She had a large mucous cyst involving the dorsal nail fold of her  right index finger with a deep groove in the  radial half of the nail plate.  This been present for more than one year.  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  Additionally, she noted active triggering  of right index finger at the A1 pulley.   Due to a failure to respond to nonoperative measures, she is brought to the  operating at this time for debridement of her DIP joint and removal of the  mucous cyst and release of the A1 pulley.   Preoperatively, informed consent was completed in the holding area with her  husband present.   PROCEDURE:  Christina [name] is brought to the operating room and placed in  supine position on the operating table.   Following light sedation, the right arm was prepped with Betadine soap and  solution and sterilely draped.  Marcaine 0.25% and 2% lidocaine were  infiltrated at the metacarpal head level to obtain a digital block and  flexor sheath block.   When anesthesia was satisfactory, the right arm was exsanguinated with  an  Esmarch bandage left on the proximal forearm as a tourniquet.   The procedure commenced with an oblique incision directly over the palpably  thickened A1 pulley.  The subcutaneous tissues were carefully divided,  taking care to gently retract the radial and ulnar proper digital nerves  from the adjacent surfaces of the A1 pulley.   The pulley was split longitudinally with scalpel and scissors.  The tendons  were delivered.  There was a thick cuff of inflamed tenosynovium on the  superficialis tendon..  This was meticulously removed with a rongeur.   Thereafter, free range of motion of the fingers recovered.   The palmar was then repaired with a mattress suture of 5-0 nylon.   Attention was then directed to the distal finger.  The finger was  exsanguinated with a gauze wrap and a half-inch wide strip of Esmarch  bandage was used as a digital tourniquet over the P1 segment.   A double Mercedes incision was fashioned exposing the extensor mechanism.   There was a bubbling mucous cyst on the dorsal radial aspect of the DIP  joint immediately radial and distal to the large osteophyte at the base of  the distal phalanx.   The cyst was excised with a 64 Beaver blade, followed by drainage of the  cyst contents from the dorsal nail fold.  The nail fold was curetted with a  microcurette to remove the membrane from the cyst cavity, followed by  excision of the dorsal osteophytes on the adjacent surfaces pf the distal  and middle phalanges.  The DIP joint was then power-irrigated with a 10 mL  syringe and a 17-gauge dental blunt needle.   The wound was then repaired with mattress sutures of 5-0 nylon.   There were no apparent complications.   Ms. Kaczorowski tolerated the surgery and anesthesia well.  She was transferred  to the recovery room with stable vital signs.      Katy Fitch Sypher, M.D.  Electronically Signed     RVS/MEDQ  D:  06/07/2005  T:  06/07/2005  Job:  [id number]

## 2010-08-28 ENCOUNTER — Encounter: Payer: Self-pay | Admitting: Internal Medicine

## 2010-08-28 NOTE — Progress Notes (Signed)
  Subjective:    Patient ID: Christina [name], female    DOB: Mar 30, 1935, 76 y.o.   MRN: [id number]  HPI   Here with acute onset 1-2 days feeling ill, general malasie and weakness, with mild left mid and lower abd pain  with feeling warm, but no high fevers, chills, n/v or bowel change or back pain. Has hx of colonoscopy with diverticulosis, but also some urinary frequency per pt , but  Denies urinary symptoms such as dysuria,  urgency,or hematuria. Pt denies new neurological symptoms such as new headache, or facial or extremity weakness or numbness   Pt denies polydipsia, polyuria.   Only taking half of the diovan due to some dizziness Past Medical History  Diagnosis Date  . Abdominal pain, left lower quadrant 04/28/2008  . ABDOMINAL PAIN, LOWER 04/16/2007  . ALLERGIC RHINITIS 12/31/2007  . ANXIETY 11/30/2006  . BACK PAIN 10/22/2008  . BAKER'S CYST, RIGHT KNEE 11/30/2006  . BURSITIS, LEFT HIP 12/31/2007  . Cardiomegaly 05/08/2010  . CHEST PAIN 03/20/2010  . DEGENERATIVE JOINT DISEASE 11/30/2006  . FATIGUE 04/16/2007  . GASTROENTERITIS WITHOUT DEHYDRATION 06/06/2007  . GERD 11/30/2006  . Headache 10/22/2008  . HYPERCHOLESTEROLEMIA 11/30/2006  . HYPERLIPIDEMIA 04/16/2007  . HYPERTENSION 11/30/2006  . HYPOKALEMIA 06/06/2007  . KNEE PAIN, RIGHT, ACUTE 01/17/2009  . OSTEOPENIA 11/30/2006  . OSTEOPOROSIS 10/22/2008  . OTHER VOICE DISTURBANCE 12/31/2007  . PERIPHERAL VASCULAR DISEASE 04/16/2007  . 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 SEDIMENTATION RATE, ELEVATED 05/03/2009  . VITAMIN D DEFICIENCY 05/08/2010   Past Surgical History  Procedure Date  . S/p right first finger t rigger finger   . S/p right cea feb 2010     Dr Hart Rochester    reports that she has never smoked. She does not have any smokeless tobacco history on file. She reports that she does not drink alcohol or use illicit drugs. family history includes Heart disease in her other. Allergies  Allergen Reactions  . Hydromorphone Hcl   . Latex   .  Sulfonamide Derivatives    No current outpatient prescriptions on file prior to visit.   Review of Systems All otherwise neg per pt     Objective:   Physical Exam BP 152/90  Pulse 77  Temp(Src) 99.9 F (37.7 C) (Oral)  Ht 5\' 1"  (1.549 m)  Wt 174 lb (78.926 kg)  BMI 32.88 kg/m2  SpO2 97% Physical Exam  VS noted, mild ill, uncomfortable appearing Constitutional: Pt appears well-developed and well-nourished.  HENT: Head: Normocephalic.  Right Ear: External ear normal.  Left Ear: External ear normal.  Eyes: Conjunctivae and EOM are normal. Pupils are equal, round, and reactive to light.  Neck: Normal range of motion. Neck supple.  Cardiovascular: Normal rate and regular rhythm.   Pulmonary/Chest: Effort normal and breath sounds normal.  Abd:  Soft, NT, non-distended, + BS except for rather mod tender to mid and LLQ, without guarding or rebound Neurological: Pt is alert. No cranial nerve deficit.  Skin: Skin is warm. No erythema.  Psychiatric: Pt behavior is normal. Thought content normal.         Assessment & Plan:

## 2010-08-28 NOTE — Assessment & Plan Note (Signed)
Mild, not clear if represents UTI  - for urine cx, for empiric tx now with antibx

## 2010-08-28 NOTE — Assessment & Plan Note (Signed)
Mid and LLQ, moderate tender, I suspect represents clinical diverticulitis;  With low grade temp, no chills, mild pain only for 1-2 days, will tx empirically for this, and hold CT and labs except for urine cx;  If cx neg would consider GI consult as she may need colonoscopy sooner than o/w scheduled for f/u

## 2010-08-28 NOTE — Assessment & Plan Note (Signed)
stable overall by hx and exam, most recent lab reviewed with pt, and pt to continue medical treatment as before  BP Readings from Last 3 Encounters:  08/23/10 152/90  05/08/10 142/72  03/20/10 152/80

## 2010-08-31 ENCOUNTER — Telehealth: Payer: Self-pay

## 2010-08-31 NOTE — Telephone Encounter (Signed)
Pt advised, denied sxs but will call back if needed.

## 2010-08-31 NOTE — Telephone Encounter (Signed)
Pt called to ask MD if the ABX she has been taking can cause dark stools. Pt states she has been noticing dark stools x 2 days. No abd pain. Please advise

## 2010-08-31 NOTE — Telephone Encounter (Signed)
Not normally, although she may wish to ask her pharmacist as well  We normally consider dark stools as possible blood;  She should consider OV if abd pain, fever , BRBPR or black stools (not otherwise accounted for by colored greens, or peptobismol which is common), or orthostatic symptoms, or other bleeding, bruising

## 2010-10-16 ENCOUNTER — Ambulatory Visit (INDEPENDENT_AMBULATORY_CARE_PROVIDER_SITE_OTHER): Payer: Medicare Other | Admitting: Internal Medicine

## 2010-10-16 ENCOUNTER — Encounter: Payer: Self-pay | Admitting: Internal Medicine

## 2010-10-16 DIAGNOSIS — K219 Gastro-esophageal reflux disease without esophagitis: Secondary | ICD-10-CM

## 2010-10-16 DIAGNOSIS — K5732 Diverticulitis of large intestine without perforation or abscess without bleeding: Secondary | ICD-10-CM

## 2010-10-16 MED ORDER — PEG-KCL-NACL-NASULF-NA ASC-C 100 G PO SOLR: 1.0000 | Freq: Once | ORAL | Status: DC

## 2010-10-16 NOTE — Assessment & Plan Note (Addendum)
5 Nexium a month for mucous in phaynx and ear pain. Originally started for chest pain and hiatal hernia on barium swallow - Dr. Lazarus Salines It seems reasonable to continue the Nexium since she had a limited supply and treat her symptoms readily. They are clearly atypical GERD symptoms but could be due to acid reflux into the oropharynx and even nasopharynx.

## 2010-10-16 NOTE — Patient Instructions (Signed)
You have been scheduled for a Colonoscopy with separate instructions given. Your prep kit has been sent to your pharmacy for you to pick up. 

## 2010-10-16 NOTE — Progress Notes (Signed)
Subjective:    Patient ID: Christina [name], female    DOB: Jun 02, 1934, 76 y.o.   MRN: [id number]  HPI This is a 76 year old white woman known to me from prior screening colonoscopy in 2003, that was unremarkable overall except for some hemorrhoids. No recently, within the past month or so she presented to her primary care physician with acute left lower cord and pain and tenderness and was felt to have diverticulitis. She was treated for her 10 days with ciprofloxacin and metronidazole and her problems resolved. Her stools became dark near the end of the antibiotic therapy though there is no clear history of melena or bleeding. Her problems began after she had been increasing not intake as well as eating some popcorn at the movies though she did not recall problems like this previously with a sort of foods. She has had Hemoccult testing through her gynecologist over the past few years, and these have been negative. She actually has a specimen to return and is asking me if she needs to do so at this point.  Her GI review of systems is otherwise negative  Past Medical History  Diagnosis Date  . ALLERGIC RHINITIS 12/31/2007  . ANXIETY 11/30/2006  . BACK PAIN 10/22/2008  . BAKER'S CYST, RIGHT KNEE 11/30/2006  . BURSITIS, LEFT HIP 12/31/2007  . Cardiomegaly 05/08/2010  . CHEST PAIN 03/20/2010  . DEGENERATIVE JOINT DISEASE 11/30/2006  . FATIGUE 04/16/2007  . GASTROENTERITIS WITHOUT DEHYDRATION 06/06/2007  . GERD 11/30/2006  . Headache 10/22/2008  . HYPERCHOLESTEROLEMIA 11/30/2006  . HYPERLIPIDEMIA 04/16/2007  . HYPERTENSION 11/30/2006  . HYPOKALEMIA 06/06/2007  . KNEE PAIN, RIGHT, ACUTE 01/17/2009    Baker's cyst  . OSTEOPENIA 11/30/2006  . OSTEOPOROSIS 10/22/2008  . PERIPHERAL VASCULAR DISEASE 04/16/2007  . 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 SEDIMENTATION RATE, ELEVATED 05/03/2009  . VITAMIN D DEFICIENCY 05/08/2010  . Anxiety   . Allergic rhinitis   . Hiatal hernia     with Schatzki's ring  .  Diverticulosis   . DJD (degenerative joint disease) of knee     bilateral  . Hemorrhoids    Past Surgical History  Procedure Date  . S/p right first finger t rigger finger   . S/p right cea feb 2010     Dr Hart Rochester  . Breast biopsy 1992    right  . Tonsillectomy and adenoidectomy   . Left ankle surgery     x 2  . Left great toe fusion   . Endometrial biopsy 1994  . Colonoscopy 2003    External hemorrhoids    reports that she has never smoked. She has never used smokeless tobacco. She reports that she drinks alcohol. She reports that she does not use illicit drugs. family history includes Anemia in her mother; Arthritis in her maternal grandmother; Breast cancer in her maternal aunt; Cirrhosis in an unspecified family member; Heart attack in an unspecified family member; Hypertension in her father; Leukemia in her paternal grandmother; Osteoporosis in her mother; Stroke in her father; and Throat cancer in an unspecified family member.  There is no history of Colon cancer. Allergies  Allergen Reactions  . Hydromorphone Hcl   . Latex   . Sulfonamide Derivatives        Review of Systems Positive for recent low-grade fever along with the pain as described above, she has some chronic fatigue problems she relates to her prior polio, she has some osteoarthritis.    Objective:   Physical Exam well-developed and well-nourished overweight to  obese elderly white woman. Eyes anicteric pupils round intact the right Oral posterior pharynx clear Neck supple without mass or thyromegaly Lungs clear Heart S1-S2 no rubs or gallops The abdomen is soft nontender without organomegaly or mass Rectal exam is deferred until colonoscopy Lower extremities are free of edema cyanosis or clubbing She is awake alert and at x3 with appropriate mood and affect There is no cervical or supraclavicular adenopathy detected        Assessment & Plan:

## 2010-10-16 NOTE — Assessment & Plan Note (Signed)
It certainly sounds like this is what her problem was. Given the possibility of polyps or perhaps even tumor triggering a problem like this, though rare, a colonoscopy is reasonable. It is 9 yearssince her last so this will serve to exclude other polyps as well as other colorectal lesions including cancer. Risks benefits indications are explained she understands and agrees to proceed. If she does not have any colorectal neoplasia and her age I don't think she will need further routine colorectal cancer screening.

## 2010-11-29 ENCOUNTER — Telehealth: Payer: Self-pay | Admitting: Internal Medicine

## 2010-11-29 NOTE — Telephone Encounter (Signed)
I spoke to her and clarified the situation as far as sedation and that we will sedate her as we did in 2003. She tolerated that fine and though cannot guarantee problem-free situation I would not change anything. She will proceed with colonoscopy under moderate sedation on 8/24.

## 2010-12-01 ENCOUNTER — Encounter: Payer: Self-pay | Admitting: Internal Medicine

## 2010-12-01 ENCOUNTER — Ambulatory Visit (AMBULATORY_SURGERY_CENTER): Payer: Medicare Other | Admitting: Internal Medicine

## 2010-12-01 VITALS — BP 155/82 | HR 72 | Temp 97.6°F | Resp 16 | Ht 61.0 in | Wt 171.0 lb

## 2010-12-01 DIAGNOSIS — K5732 Diverticulitis of large intestine without perforation or abscess without bleeding: Secondary | ICD-10-CM

## 2010-12-01 DIAGNOSIS — K5730 Diverticulosis of large intestine without perforation or abscess without bleeding: Secondary | ICD-10-CM

## 2010-12-01 MED ORDER — SODIUM CHLORIDE 0.9 % IV SOLN: 500.0000 mL | INTRAVENOUS | Status: DC

## 2010-12-01 NOTE — Progress Notes (Signed)
Pt states she has had polio in the past and doesn't do well with anesthesia.  She says her and Dr Leone Payor spoke about this over the phone.  Paper placed on front of chart that pt brought in with her explaining problem.

## 2010-12-01 NOTE — Patient Instructions (Addendum)
Colonoscopy showed diverticulosis but no active diverticulitis today. It certainly sounds like you had diverticulitis earlier this year, an inflammatory and infectious condition that is treated with antibiotics. You also have some external hemorrhoids as in the past. There were no polyps or signs of cancer. I can see you as needed and I would not recommend a routine repeat colonoscopy in the future but would do one if signs or symptoms warranted it. Iva Boop, MD, [facility]  Please refer to your blue and neon green sheets for instructions regarding diet and activity for the rest of today.  You may resume your medications as you would normally take them.

## 2010-12-01 NOTE — Progress Notes (Signed)
Pressure applied to abdomen to reach cecum.  

## 2010-12-04 ENCOUNTER — Telehealth: Payer: Self-pay | Admitting: *Deleted

## 2010-12-04 NOTE — Telephone Encounter (Signed)

## 2011-01-26 ENCOUNTER — Ambulatory Visit (INDEPENDENT_AMBULATORY_CARE_PROVIDER_SITE_OTHER): Payer: Medicare Other

## 2011-01-26 DIAGNOSIS — Z23 Encounter for immunization: Secondary | ICD-10-CM

## 2011-03-09 ENCOUNTER — Other Ambulatory Visit: Payer: Self-pay

## 2011-03-09 MED ORDER — ROSUVASTATIN CALCIUM 20 MG PO TABS: 20.0000 mg | ORAL_TABLET | Freq: Every day | ORAL | Status: DC

## 2011-05-08 ENCOUNTER — Other Ambulatory Visit: Payer: Self-pay | Admitting: Gynecology

## 2011-05-08 DIAGNOSIS — Z1231 Encounter for screening mammogram for malignant neoplasm of breast: Secondary | ICD-10-CM

## 2011-05-09 ENCOUNTER — Telehealth: Payer: Self-pay

## 2011-05-09 NOTE — Telephone Encounter (Signed)
Patient informed of MD's instructions

## 2011-05-09 NOTE — Telephone Encounter (Signed)
Patient is having chest discomfort, starts in her back and is relieved with burping.  She states is different from previous acid reflux. Please advise if OV as she is concerned that may be a new problem. Call back number is [phone]

## 2011-05-09 NOTE — Telephone Encounter (Signed)
Ok to try OTC gasx for now, cont the nexium, and continue to monitor for now unless there is worsening pain, n/v, fever or other abd pain/chest pain

## 2011-05-22 ENCOUNTER — Ambulatory Visit
Admission: RE | Admit: 2011-05-22 | Discharge: 2011-05-22 | Disposition: A | Discharge status: Home / Self Care | Payer: Medicare Other | Source: Ambulatory Visit | Attending: Gynecology | Admitting: Gynecology

## 2011-05-22 DIAGNOSIS — Z1231 Encounter for screening mammogram for malignant neoplasm of breast: Secondary | ICD-10-CM

## 2011-05-23 ENCOUNTER — Ambulatory Visit (INDEPENDENT_AMBULATORY_CARE_PROVIDER_SITE_OTHER)
Admission: RE | Admit: 2011-05-23 | Discharge: 2011-05-23 | Disposition: A | Discharge status: Home / Self Care | Payer: Medicare Other | Source: Ambulatory Visit

## 2011-05-23 ENCOUNTER — Ambulatory Visit (INDEPENDENT_AMBULATORY_CARE_PROVIDER_SITE_OTHER): Payer: Medicare Other | Admitting: Internal Medicine

## 2011-05-23 ENCOUNTER — Encounter: Payer: Self-pay | Admitting: Internal Medicine

## 2011-05-23 ENCOUNTER — Other Ambulatory Visit (INDEPENDENT_AMBULATORY_CARE_PROVIDER_SITE_OTHER): Payer: Medicare Other

## 2011-05-23 VITALS — BP 122/80 | HR 66 | Temp 97.1°F | Ht 61.0 in | Wt 167.5 lb

## 2011-05-23 DIAGNOSIS — M810 Age-related osteoporosis without current pathological fracture: Secondary | ICD-10-CM

## 2011-05-23 DIAGNOSIS — Z0000 Encounter for general adult medical examination without abnormal findings: Secondary | ICD-10-CM

## 2011-05-23 DIAGNOSIS — Z79899 Other long term (current) drug therapy: Secondary | ICD-10-CM

## 2011-05-23 DIAGNOSIS — R079 Chest pain, unspecified: Secondary | ICD-10-CM

## 2011-05-23 LAB — CBC WITH DIFFERENTIAL/PLATELET
Basophils Absolute: 0 10*3/uL (ref 0.0–0.1)
Basophils Relative: 0.5 % (ref 0.0–3.0)
Eosinophils Absolute: 0.1 10*3/uL (ref 0.0–0.7)
Hemoglobin: 12.9 g/dL (ref 12.0–15.0)
MCHC: 33.3 g/dL (ref 30.0–36.0)
MCV: 93.6 fl (ref 78.0–100.0)
Monocytes Absolute: 0.5 10*3/uL (ref 0.1–1.0)
Neutro Abs: 3.8 10*3/uL (ref 1.4–7.7)
RBC: 4.12 Mil/uL (ref 3.87–5.11)
RDW: 14.1 % (ref 11.5–14.6)

## 2011-05-23 LAB — BASIC METABOLIC PANEL
CO2: 27 mEq/L (ref 19–32)
Chloride: 108 mEq/L (ref 96–112)
Creatinine, Ser: 0.5 mg/dL (ref 0.4–1.2)
Sodium: 141 mEq/L (ref 135–145)

## 2011-05-23 LAB — LIPID PANEL
LDL Cholesterol: 51 mg/dL (ref 0–99)
Total CHOL/HDL Ratio: 2
Triglycerides: 62 mg/dL (ref 0.0–149.0)

## 2011-05-23 LAB — HEPATIC FUNCTION PANEL
ALT: 15 U/L (ref 0–35)
Alkaline Phosphatase: 41 U/L (ref 39–117)
Bilirubin, Direct: 0.1 mg/dL (ref 0.0–0.3)
Total Bilirubin: 0.6 mg/dL (ref 0.3–1.2)
Total Protein: 7.7 g/dL (ref 6.0–8.3)

## 2011-05-23 LAB — URINALYSIS, ROUTINE W REFLEX MICROSCOPIC
Bilirubin Urine: NEGATIVE
Hgb urine dipstick: NEGATIVE
Ketones, ur: NEGATIVE
Total Protein, Urine: NEGATIVE
Urine Glucose: NEGATIVE
Urobilinogen, UA: 0.2 (ref 0.0–1.0)

## 2011-05-23 MED ORDER — ROSUVASTATIN CALCIUM 20 MG PO TABS: 20.0000 mg | ORAL_TABLET | Freq: Every day | ORAL | Status: DC

## 2011-05-23 MED ORDER — VALSARTAN 320 MG PO TABS: 320.0000 mg | ORAL_TABLET | Freq: Every day | ORAL | Status: DC

## 2011-05-23 MED ORDER — ESOMEPRAZOLE MAGNESIUM 40 MG PO CPDR: 40.0000 mg | DELAYED_RELEASE_CAPSULE | Freq: Every day | ORAL | Status: DC

## 2011-05-23 NOTE — Assessment & Plan Note (Signed)

## 2011-05-23 NOTE — Assessment & Plan Note (Signed)
Recent t-score by per Dr Nicholas Lose office not supported by raw data on information presented today;  ? Software logic malfunction in coming up with the conclusion that is then used by the MD for tx;  Would be concerned about risk of brittle bone with ongoing bisphos use more than 5 yrs;  Pt interested in "second opinion" - will repeat dxa here and re-eval need for ongoing tx

## 2011-05-23 NOTE — Patient Instructions (Addendum)
OK to stop the atelvia for now Please schedule the bone density with the [facility] as you leave today Please go to LAB in the Basement for the blood and/or urine tests to be done today Please call the phone number [phone] (the PhoneTree System) for results of testing in 2-3 days;  When calling, simply dial the number, and when prompted enter the MRN number above (the Medical Record Number) and the # key, then the message should start. Your medications were refilled to the pharmacy - [facility] Please return in 1 year for your yearly visit, or sooner if needed, with Lab testing done 3-5 days before

## 2011-05-23 NOTE — Progress Notes (Signed)
Subjective:    Patient ID: Christina [name], female    DOB: 04/15/1934, 77 y.o.   MRN: [id number]  HPI  Here for wellness and f/u;  Overall doing ok;  Pt denies CP, worsening SOB, DOE, wheezing, orthopnea, PND, worsening LE edema,  dizziness or syncope.  Pt denies neurological change such as new Headache, facial or extremity weakness.  Pt denies polydipsia, polyuria, or low sugar symptoms. Pt states overall good compliance with treatment and medications, good tolerability, and trying to follow lower cholesterol diet.  Pt denies worsening depressive symptoms, suicidal ideation or panic. No fever, wt loss, night sweats, loss of appetite, or other constitutional symptoms.  Pt states good ability with ADL's, low fall risk, home safety reviewed and adequate, no significant changes in hearing or vision, and occasionally active with exercise.  Has occas fast HR with back pain, and arm soreness that will wake her up at night, becomes anxious.  Christina [name] when occurs, sits up and symptoms seem to resolved in a few hrs, occurred last night and sitting up lost a few hrs of sleep before getting up this AM.  Worried it may have somehting to do with carotids, only occurs at night, has occas left neck pain with known arthrits wit some pain radiating to the upper arm. Has on going msk pain to the left side with her post polio as well, and gait slight disturbance to pain and left leg length discrepancy.  Sees Christina [name] surg for carotid f/u - has appt feb 26.  Also on Atelvia 35 mg wkly after seeing GYN/Christina [name] for Christina [name] with last dxa mar 2012 with t score conclusion of -3.0, but this is not supported by the raw data , so this is confusing to me.  Has been on actonel previously fo r> 5 yrs, now on atelvia for approx 2 yrs. Past Medical History  Diagnosis Date  . ALLERGIC RHINITIS 12/31/2007  . ANXIETY 11/30/2006  . BAKER'S CYST, RIGHT KNEE 11/30/2006  . BURSITIS, LEFT HIP 12/31/2007  . Cardiomegaly 05/08/2010  .  DEGENERATIVE JOINT DISEASE 11/30/2006  . GASTROENTERITIS WITHOUT DEHYDRATION 06/06/2007  . GERD 11/30/2006  . Headache 10/22/2008  . HYPERCHOLESTEROLEMIA 11/30/2006  . HYPERLIPIDEMIA 04/16/2007  . HYPERTENSION 11/30/2006  . OSTEOPOROSIS 10/22/2008  . PERIPHERAL VASCULAR DISEASE 04/16/2007  . 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 VITAMIN D DEFICIENCY 05/08/2010  . Anxiety   . Allergic rhinitis   . Hiatal hernia     with Schatzki's ring  . Diverticulosis   . DJD (degenerative joint disease) of knee     bilateral  . Hemorrhoids    Past Surgical History  Procedure Date  . S/p right first finger t rigger finger   . S/p right cea feb 2010     Christina [name]  . Breast biopsy 1992    right  . Tonsillectomy and adenoidectomy   . Left ankle surgery     x 2  . Left great toe fusion   . Endometrial biopsy 1994  . Colonoscopy 2003; 11/2310    2003:External hemorrhoids 2012: diverticulosis, external hemorrhoids    reports that she has never smoked. She has never used smokeless tobacco. She reports that she drinks alcohol. She reports that she does not use illicit drugs. family history includes Anemia in her mother; Arthritis in her maternal grandmother; Breast cancer in her maternal aunt; Cirrhosis in an unspecified family member; Heart attack in an unspecified family member; Hypertension in her father; Leukemia in her paternal grandmother;  Osteoporosis in her mother; Stroke in her father; and Throat cancer in an unspecified family member.  There is no history of Colon cancer. Allergies  Allergen Reactions  . Hydromorphone Hcl   . Latex   . Sulfonamide Derivatives    Current Outpatient Prescriptions on File Prior to Visit  Medication Sig Dispense Refill  . aspirin 81 MG EC tablet Take 81 mg by mouth daily.        . Calcium Carbonate-Vit D-Min (CALCIUM 1200 PO) Take by mouth daily.        . Cholecalciferol (VITAMIN D) 2000 UNITS CAPS Take by mouth daily.        Marland Kitchen esomeprazole (NEXIUM) 40 MG capsule Take 40  mg by mouth. As needed      . estradiol (ESTRACE VAGINAL) 0.1 MG/GM vaginal cream As directed      . Risedronate Sodium (ATELVIA) 35 MG TBEC 1 by mouth every week       . rosuvastatin (CRESTOR) 20 MG tablet Take 1 tablet (20 mg total) by mouth daily.  30 tablet  1  . Testosterone Propionate (FIRST-TESTOSTERONE MC) 2 % CREA As needed      . valsartan (DIOVAN) 320 MG tablet Take by mouth. Take 1/2 if blood pressure is under 140       Review of Systems Review of Systems  Constitutional: Negative for diaphoresis, activity change, appetite change and unexpected weight change.  HENT: Negative for hearing loss, ear pain, facial swelling, mouth sores and neck stiffness.   Eyes: Negative for pain, redness and visual disturbance.  Respiratory: Negative for shortness of breath and wheezing.   Cardiovascular: Negative for chest pain and palpitations.  Gastrointestinal: Negative for diarrhea, blood in stool, abdominal distention and rectal pain.  Genitourinary: Negative for hematuria, flank pain and decreased urine volume.  Musculoskeletal: Negative for myalgias and joint swelling.  Skin: Negative for color change and wound.  Neurological: Negative for syncope and numbness.  Hematological: Negative for adenopathy.  Psychiatric/Behavioral: Negative for hallucinations, self-injury, decreased concentration and agitation.      Objective:   Physical Exam BP 122/80  Pulse 66  Temp(Src) 97.1 F (36.2 C) (Oral)  Ht 5\' 1"  (1.549 m)  Wt 167 lb 8 oz (75.978 kg)  BMI 31.65 kg/m2  SpO2 95% Physical Exam  VS noted Constitutional: Pt is oriented to person, place, and time. Appears well-developed and well-nourished.  HENT:  Head: Normocephalic and atraumatic.  Right Ear: External ear normal.  Left Ear: External ear normal.  Nose: Nose normal.  Mouth/Throat: Oropharynx is clear and moist.  Eyes: Conjunctivae and EOM are normal. Pupils are equal, round, and reactive to light.  Neck: Normal range of  motion. Neck supple. No JVD present. No tracheal deviation present.  Cardiovascular: Normal rate, regular rhythm, normal heart sounds and intact distal pulses.   Pulmonary/Chest: Effort normal and breath sounds normal.  Abdominal: Soft. Bowel sounds are normal. There is no tenderness.  Musculoskeletal: Normal range of motion. Exhibits no edema.  Lymphadenopathy:  Has no cervical adenopathy.  Neurological: Pt is alert and oriented to person, place, and time. Pt has normal reflexes. No cranial nerve deficit.  Skin: Skin is warm and dry. No rash noted.  Psychiatric:  Has  normal mood and affect. Behavior is normal.     Assessment & Plan:

## 2011-05-27 ENCOUNTER — Encounter: Payer: Self-pay | Admitting: Internal Medicine

## 2011-05-29 ENCOUNTER — Other Ambulatory Visit: Payer: MEDICARE

## 2011-05-29 ENCOUNTER — Ambulatory Visit: Payer: MEDICARE | Admitting: Vascular Surgery

## 2011-05-30 ENCOUNTER — Encounter: Payer: Self-pay | Admitting: Internal Medicine

## 2011-06-04 ENCOUNTER — Encounter: Payer: Self-pay | Admitting: Vascular Surgery

## 2011-06-05 ENCOUNTER — Ambulatory Visit (INDEPENDENT_AMBULATORY_CARE_PROVIDER_SITE_OTHER): Payer: Medicare Other | Admitting: Vascular Surgery

## 2011-06-05 ENCOUNTER — Other Ambulatory Visit (INDEPENDENT_AMBULATORY_CARE_PROVIDER_SITE_OTHER): Payer: Medicare Other | Admitting: *Deleted

## 2011-06-05 ENCOUNTER — Encounter: Payer: Self-pay | Admitting: Vascular Surgery

## 2011-06-05 VITALS — BP 144/60 | HR 68 | Resp 16 | Ht 60.0 in | Wt 168.0 lb

## 2011-06-05 DIAGNOSIS — I6529 Occlusion and stenosis of unspecified carotid artery: Secondary | ICD-10-CM

## 2011-06-05 DIAGNOSIS — Z48812 Encounter for surgical aftercare following surgery on the circulatory system: Secondary | ICD-10-CM

## 2011-06-05 NOTE — Progress Notes (Signed)
Addended by: Sharee Pimple on: 06/05/2011 11:22 AM   Modules accepted: Orders

## 2011-06-05 NOTE — Progress Notes (Signed)
Subjective:     Patient ID: Christina [name], female   DOB: [birth year], 77 y.o.   MRN: [id number]  HPI this 77 year old female returns for continued followup regarding her carotid occlusive disease. She underwent right carotid endarterectomy in 2010 following discovery of an asymptomatic severe right internal carotid stenosis on [facility] screening. She denies any active neurologic symptoms such as lateralizing weakness, amaurosis fugax, diplopia, blurred vision, syncope. She does take one aspirin per day. He does have a history of polio affecting her left side which affects her ambulation. She does have occasional tingling in the left side from the polio.  Past Medical History  Diagnosis Date  . ALLERGIC RHINITIS 12/31/2007  . ANXIETY 11/30/2006  . BAKER'S CYST, RIGHT KNEE 11/30/2006  . BURSITIS, LEFT HIP 12/31/2007  . Cardiomegaly 05/08/2010  . DEGENERATIVE JOINT DISEASE 11/30/2006  . GASTROENTERITIS WITHOUT DEHYDRATION 06/06/2007  . GERD 11/30/2006  . Headache 10/22/2008  . HYPERCHOLESTEROLEMIA 11/30/2006  . HYPERLIPIDEMIA 04/16/2007  . HYPERTENSION 11/30/2006  . OSTEOPOROSIS 10/22/2008  . PERIPHERAL VASCULAR DISEASE 04/16/2007  . 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 VITAMIN D DEFICIENCY 05/08/2010  . Anxiety   . Allergic rhinitis   . Hiatal hernia     with Schatzki's ring  . Diverticulosis   . DJD (degenerative joint disease) of knee     bilateral  . Hemorrhoids   . Carotid artery occlusion 2010    History  Substance Use Topics  . Smoking status: Never Smoker   . Smokeless tobacco: Never Used  . Alcohol Use: Yes     occ glass of wine     Family History  Problem Relation Age of Onset  . Stroke Father     mother, MGM  . Hypertension Father     mother, MGM  . Deep vein thrombosis Father   . Heart disease Father   . Osteoporosis Mother   . Anemia Mother   . Hypertension Mother   . Stroke Mother   . Leukemia Paternal Grandmother     MGF  . Heart attack      uncle  . Cirrhosis     uncle  . Throat cancer      uncle  . Breast cancer Maternal Aunt   . Arthritis Maternal Grandmother   . Colon cancer Neg Hx     Allergies  Allergen Reactions  . Hydromorphone Hcl   . Latex   . Sulfonamide Derivatives     Current outpatient prescriptions:Risedronate Sodium (ATELVIA) 35 MG TBEC, Take 35 mg by mouth once a week., Disp: , Rfl: ;  aspirin 81 MG EC tablet, Take 81 mg by mouth daily.  , Disp: , Rfl: ;  Calcium Carbonate-Vit D-Min (CALCIUM 1200 PO), Take by mouth daily.  , Disp: , Rfl: ;  Cholecalciferol (VITAMIN D) 2000 UNITS CAPS, Take by mouth daily.  , Disp: , Rfl:  esomeprazole (NEXIUM) 40 MG capsule, Take 1 capsule (40 mg total) by mouth daily. As needed, Disp: 90 capsule, Rfl: 3;  estradiol (ESTRACE VAGINAL) 0.1 MG/GM vaginal cream, As directed, Disp: , Rfl: ;  rosuvastatin (CRESTOR) 20 MG tablet, Take 1 tablet (20 mg total) by mouth daily., Disp: 90 tablet, Rfl: 3;  Testosterone Propionate (FIRST-TESTOSTERONE MC) 2 % CREA, As needed, Disp: , Rfl:  valsartan (DIOVAN) 320 MG tablet, Take 1 tablet (320 mg total) by mouth daily. Take 1/2 if blood pressure is under 140, Disp: 90 tablet, Rfl: 3  BP 144/60  Pulse 68  Resp 16  Ht 5' (1.524  m)  Wt 168 lb (76.204 kg)  BMI 32.81 kg/m2  SpO2 100%  Body mass index is 32.81 kg/(m^2).         Review of Systems denies chest pain, dyspnea on exertion, PND, orthopnea. Does have easy fatigability and occasional discomfort along her right side. A final tingling in the left side. Other systems are negative and complete review of systems     Objective:   Physical Exam pressure 1 4460 heart rate 68 respirations 16 Gen.-alert and oriented x3 in no apparent distress HEENT normal for age Lungs no rhonchi or wheezing Cardiovascular regular rhythm no murmurs carotid pulses 3+ palpable no bruits audible Abdomen soft nontender no palpable masses Musculoskeletal free of  major deformities Skin clear -no rashes Neurologic normal mild  weakness on the left side Lower extremities 3+ femoral and dorsalis pedis pulses palpable bilaterally with no edema  Today I ordered a carotid duplex exam which revealed no evidence of flow reduction at her right carotid endarterectomy site. There is minimal flow reduction in her left internal carotid artery. I reviewed and interpreted this study.     Assessment:     Doing well post right carotid endarterectomy with no evidence of restenosis    Plan:     Return in one year with followup carotid duplex exam Continue daily aspirin

## 2011-06-11 NOTE — Procedures (Unsigned)
CAROTID DUPLEX EXAM  INDICATION:  Carotid disease  HISTORY: Diabetes:  No Cardiac:  No Hypertension:  No Smoking:  No Previous Surgery:  Right CEA 05/26/2008 CV History:  Currently asymptomatic Amaurosis Fugax No, Paresthesias No, Hemiparesis No                                      RIGHT             LEFT Brachial systolic pressure:         126               124 Brachial Doppler waveforms:         Normal            Normal Vertebral direction of flow:        Antegrade         Antegrade DUPLEX VELOCITIES (cm/sec) CCA peak systolic                   87                77 ECA peak systolic                   86                87 ICA peak systolic                   97                111 ICA end diastolic                   34                33 PLAQUE MORPHOLOGY:                                    Heterogeneous PLAQUE AMOUNT:                      None              Mild PLAQUE LOCATION:                                      ICA, ECA  IMPRESSION: 1. Patent right carotid endarterectomy site with no evidence of     restenosis. 2. Left internal carotid artery velocities suggest 1% to 39% stenosis. 3. Antegrade vertebral arteries bilaterally.  ___________________________________________ [name], M.D.  EM/MEDQ  D:  06/05/2011  T:  06/05/2011  Job:  [id number]

## 2011-06-26 ENCOUNTER — Other Ambulatory Visit: Payer: Self-pay | Admitting: Gynecology

## 2011-07-25 ENCOUNTER — Other Ambulatory Visit: Payer: Self-pay | Admitting: Dermatology

## 2011-07-26 ENCOUNTER — Telehealth: Payer: Self-pay

## 2011-07-26 MED ORDER — CIPROFLOXACIN HCL 500 MG PO TABS: 500.0000 mg | ORAL_TABLET | Freq: Two times a day (BID) | ORAL | Status: AC

## 2011-07-26 NOTE — Telephone Encounter (Signed)
Done per emr 

## 2011-07-26 NOTE — Telephone Encounter (Signed)
Pt called stating she will be going on a cruise to [place] for 2 weeks on May 1st. Pt is requesting Rx for ABX to take on her trip, please advise.

## 2011-07-26 NOTE — Telephone Encounter (Signed)
Patient informed. 

## 2011-10-08 ENCOUNTER — Ambulatory Visit (INDEPENDENT_AMBULATORY_CARE_PROVIDER_SITE_OTHER): Payer: Medicare Other | Admitting: Internal Medicine

## 2011-10-08 ENCOUNTER — Encounter: Payer: Self-pay | Admitting: Internal Medicine

## 2011-10-08 VITALS — BP 144/80 | HR 66 | Temp 98.2°F | Ht 61.0 in | Wt 172.4 lb

## 2011-10-08 DIAGNOSIS — E7800 Pure hypercholesterolemia, unspecified: Secondary | ICD-10-CM

## 2011-10-08 DIAGNOSIS — R2240 Localized swelling, mass and lump, unspecified lower limb: Secondary | ICD-10-CM

## 2011-10-08 DIAGNOSIS — M810 Age-related osteoporosis without current pathological fracture: Secondary | ICD-10-CM

## 2011-10-08 DIAGNOSIS — R229 Localized swelling, mass and lump, unspecified: Secondary | ICD-10-CM

## 2011-10-08 DIAGNOSIS — I10 Essential (primary) hypertension: Secondary | ICD-10-CM

## 2011-10-08 NOTE — Patient Instructions (Addendum)
For now, please use some sort of gauze covering the irritated area to help protect, cushion and allow to heal If not improved in 1-2 wks (or worse) please call for podiatry referral Continue all other medications as before

## 2011-10-13 ENCOUNTER — Encounter: Payer: Self-pay | Admitting: Internal Medicine

## 2011-10-13 DIAGNOSIS — R2240 Localized swelling, mass and lump, unspecified lower limb: Secondary | ICD-10-CM | POA: Insufficient documentation

## 2011-10-13 NOTE — Progress Notes (Signed)
Subjective:    Patient ID: Christina [name], female    DOB: 03-15-1935, 77 y.o.   MRN: [id number]  HPI  Here with c/o unusual 1 wk mild swollen mass like area to the left dorsal foot, without pain but left foot has decreased sensation related to postpolio;  ? Cyst, no prior similar in past.  Just back from a cruise with quite a bit of walking in what she thought was OK shoes. Nothing makes better or worse, but mass already some improving in size in past day.  Pt denies chest pain, increased sob or doe, wheezing, orthopnea, PND, increased LE swelling, palpitations, dizziness or syncope.   Pt denies polydipsia, polyuria.  Pt denies new neurological symptoms such as new headache, or facial or extremity weakness or numbness.   Pt denies fever, wt loss, night sweats, loss of appetite, or other constitutional symptoms. No other recent trauma or joint pain/swelling Trying to follow lower chol diet.   Past Medical History  Diagnosis Date  . ALLERGIC RHINITIS 12/31/2007  . ANXIETY 11/30/2006  . BAKER'S CYST, RIGHT KNEE 11/30/2006  . BURSITIS, LEFT HIP 12/31/2007  . Cardiomegaly 05/08/2010  . DEGENERATIVE JOINT DISEASE 11/30/2006  . GASTROENTERITIS WITHOUT DEHYDRATION 06/06/2007  . GERD 11/30/2006  . Headache 10/22/2008  . HYPERCHOLESTEROLEMIA 11/30/2006  . HYPERLIPIDEMIA 04/16/2007  . HYPERTENSION 11/30/2006  . OSTEOPOROSIS 10/22/2008  . PERIPHERAL VASCULAR DISEASE 04/16/2007  . 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 VITAMIN D DEFICIENCY 05/08/2010  . Anxiety   . Allergic rhinitis   . Hiatal hernia     with Schatzki's ring  . Diverticulosis   . DJD (degenerative joint disease) of knee     bilateral  . Hemorrhoids   . Carotid artery occlusion 2010   Past Surgical History  Procedure Date  . S/p right first finger t rigger finger   . S/p right cea feb 2010     Dr Hart Rochester  . Breast biopsy 1992    right  . Tonsillectomy and adenoidectomy   . Left ankle surgery     x 2  . Left great toe fusion   . Endometrial  biopsy 1994  . Colonoscopy 2003; 11/2310    2003:External hemorrhoids 2012: diverticulosis, external hemorrhoids  . Carotid endarterectomy 2010    reports that she has never smoked. She has never used smokeless tobacco. She reports that she drinks alcohol. She reports that she does not use illicit drugs. family history includes Anemia in her mother; Arthritis in her maternal grandmother; Breast cancer in her maternal aunt; Cirrhosis in an unspecified family member; Deep vein thrombosis in her father; Heart attack in an unspecified family member; Heart disease in her father; Hypertension in her father and mother; Leukemia in her paternal grandmother; Osteoporosis in her mother; Stroke in her father and mother; and Throat cancer in an unspecified family member.  There is no history of Colon cancer. Allergies  Allergen Reactions  . Hydromorphone Hcl   . Latex   . Sulfonamide Derivatives    Current Outpatient Prescriptions on File Prior to Visit  Medication Sig Dispense Refill  . aspirin 81 MG EC tablet Take 81 mg by mouth daily.        . Cholecalciferol (VITAMIN D) 2000 UNITS CAPS Take by mouth daily.        Marland Kitchen esomeprazole (NEXIUM) 40 MG capsule Take 1 capsule (40 mg total) by mouth daily. As needed  90 capsule  3  . estradiol (ESTRACE VAGINAL) 0.1 MG/GM vaginal cream As directed      .  rosuvastatin (CRESTOR) 20 MG tablet Take 1 tablet (20 mg total) by mouth daily.  90 tablet  3  . Testosterone Propionate (FIRST-TESTOSTERONE MC) 2 % CREA As needed      . valsartan (DIOVAN) 320 MG tablet Take 1 tablet (320 mg total) by mouth daily. Take 1/2 if blood pressure is under 140  90 tablet  3  . Calcium Carbonate-Vit D-Min (CALCIUM 1200 PO) Take by mouth daily.        . Risedronate Sodium (ATELVIA) 35 MG TBEC Take 35 mg by mouth once a week.       Review of Systems Review of Systems  Constitutional: Negative for diaphoresis and unexpected weight change.  HENT: Negative for drooling and tinnitus.     Eyes: Negative for photophobia and visual disturbance.  Respiratory: Negative for choking and stridor.   Gastrointestinal: Negative for vomiting and blood in stool.  Genitourinary: Negative for hematuria and decreased urine volume.  Skin: Negative for color change and wound.  Psychiatric/Behavioral: Negative for decreased concentration. The patient is not hyperactive.      Objective:   Physical Exam BP 144/80  Pulse 66  Temp 98.2 F (36.8 C) (Oral)  Ht 5\' 1"  (1.549 m)  Wt 172 lb 6 oz (78.189 kg)  BMI 32.57 kg/m2  SpO2 96% Physical Exam  VS noted Constitutional: Pt appears well-developed and well-nourished.  HENT: Head: Normocephalic.  Right Ear: External ear normal.  Left Ear: External ear normal.  Eyes: Conjunctivae and EOM are normal. Pupils are equal, round, and reactive to light.  Neck: Normal range of motion. Neck supple.  Cardiovascular: Normal rate and regular rhythm.   Pulmonary/Chest: Effort normal and breath sounds normal.  Neurological: Pt is alert. Not confused, decreased sens to LT left foot with mild atrophy Skin: Skin is warm. No erythema. Left dorsal foot with first dorsal compartment mid foot 1 cm firm mass subq, mobile, NT, nonfluctuant, nondraining without overlying skin change except mild erythema Psychiatric: Pt behavior is normal. Thought content normal.     Assessment & Plan:

## 2011-10-13 NOTE — Assessment & Plan Note (Signed)
stable overall by hx and exam, most recent data reviewed with pt, and pt to continue medical treatment as before BP Readings from Last 3 Encounters:  10/08/11 144/80  06/05/11 144/60  05/23/11 122/80

## 2011-10-13 NOTE — Assessment & Plan Note (Signed)
stable overall by hx and exam, most recent data reviewed with pt, and pt to continue medical treatment as before Lab Results  Component Value Date   LDLCALC 51 05/23/2011

## 2011-10-13 NOTE — Assessment & Plan Note (Signed)
Mass c/w ganglion cyst like swelling area likely related to irritation due to walking in her particular shoes in relatively insensate foot, some improved already, for now for simple covering with bandaid or gauze to protect, wear soft shoes, less walking for now, and podiatry referral if not improved 1-2 wks

## 2011-10-13 NOTE — Assessment & Plan Note (Signed)
Declines to take further atelvia or other tx at this time

## 2011-11-05 ENCOUNTER — Telehealth: Payer: Self-pay

## 2011-11-05 DIAGNOSIS — M722 Plantar fascial fibromatosis: Secondary | ICD-10-CM

## 2011-11-05 NOTE — Telephone Encounter (Signed)
Referral done

## 2011-11-05 NOTE — Telephone Encounter (Signed)
The patient would like a referral to Dr. Harriet Pho (Podiatrist [facility] [place]). States this was discussed at her last office visit.  She will only be home from August 7 - 15 for the month of August.

## 2011-12-26 ENCOUNTER — Ambulatory Visit (INDEPENDENT_AMBULATORY_CARE_PROVIDER_SITE_OTHER): Payer: Medicare Other

## 2011-12-26 DIAGNOSIS — Z23 Encounter for immunization: Secondary | ICD-10-CM

## 2012-04-01 ENCOUNTER — Ambulatory Visit (INDEPENDENT_AMBULATORY_CARE_PROVIDER_SITE_OTHER): Payer: Medicare Other | Admitting: Internal Medicine

## 2012-04-01 ENCOUNTER — Encounter: Payer: Self-pay | Admitting: Internal Medicine

## 2012-04-01 VITALS — BP 134/80 | HR 74 | Temp 97.2°F | Ht 61.0 in | Wt 171.1 lb

## 2012-04-01 DIAGNOSIS — I10 Essential (primary) hypertension: Secondary | ICD-10-CM

## 2012-04-01 DIAGNOSIS — J209 Acute bronchitis, unspecified: Secondary | ICD-10-CM | POA: Insufficient documentation

## 2012-04-01 DIAGNOSIS — F411 Generalized anxiety disorder: Secondary | ICD-10-CM

## 2012-04-01 MED ORDER — HYDROCODONE-HOMATROPINE 5-1.5 MG/5ML PO SYRP: 5.0000 mL | ORAL_SOLUTION | Freq: Four times a day (QID) | ORAL | Status: DC | PRN

## 2012-04-01 MED ORDER — LEVOFLOXACIN 250 MG PO TABS: 250.0000 mg | ORAL_TABLET | Freq: Every day | ORAL | Status: DC

## 2012-04-01 NOTE — Progress Notes (Signed)
Subjective:    Patient ID: Christina [name], female    DOB: December 19, 1934, 77 y.o.   MRN: [id number]  HPI  Here with acute onset mild to mod 2-3 wks ST, HA, general weakness and malaise, now with prod cough greenish sputum, but Pt denies chest pain, increased sob or doe, wheezing, orthopnea, PND, increased LE swelling, palpitations, dizziness or syncope. Pt denies new neurological symptoms such as new headache, or facial or extremity weakness or numbness   Pt denies polydipsia, polyuria, Denies worsening depressive symptoms, suicidal ideation, or panic.  No other complaints Past Medical History  Diagnosis Date  . ALLERGIC RHINITIS 12/31/2007  . ANXIETY 11/30/2006  . BAKER'S CYST, RIGHT KNEE 11/30/2006  . BURSITIS, LEFT HIP 12/31/2007  . Cardiomegaly 05/08/2010  . DEGENERATIVE JOINT DISEASE 11/30/2006  . GASTROENTERITIS WITHOUT DEHYDRATION 06/06/2007  . GERD 11/30/2006  . Headache 10/22/2008  . HYPERCHOLESTEROLEMIA 11/30/2006  . HYPERLIPIDEMIA 04/16/2007  . HYPERTENSION 11/30/2006  . OSTEOPOROSIS 10/22/2008  . PERIPHERAL VASCULAR DISEASE 04/16/2007  . 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 VITAMIN D DEFICIENCY 05/08/2010  . Anxiety   . Allergic rhinitis   . Hiatal hernia     with Schatzki's ring  . Diverticulosis   . DJD (degenerative joint disease) of knee     bilateral  . Hemorrhoids   . Carotid artery occlusion 2010   Past Surgical History  Procedure Date  . S/p right first finger t rigger finger   . S/p right cea feb 2010     Dr Hart Rochester  . Breast biopsy 1992    right  . Tonsillectomy and adenoidectomy   . Left ankle surgery     x 2  . Left great toe fusion   . Endometrial biopsy 1994  . Colonoscopy 2003; 11/2310    2003:External hemorrhoids 2012: diverticulosis, external hemorrhoids  . Carotid endarterectomy 2010    reports that she has never smoked. She has never used smokeless tobacco. She reports that she drinks alcohol. She reports that she does not use illicit drugs. family history  includes Anemia in her mother; Arthritis in her maternal grandmother; Breast cancer in her maternal aunt; Cirrhosis in an unspecified family member; Deep vein thrombosis in her father; Heart attack in an unspecified family member; Heart disease in her father; Hypertension in her father and mother; Leukemia in her paternal grandmother; Osteoporosis in her mother; Stroke in her father and mother; and Throat cancer in an unspecified family member.  There is no history of Colon cancer. Allergies  Allergen Reactions  . Hydromorphone Hcl   . Latex   . Sulfonamide Derivatives    Current Outpatient Prescriptions on File Prior to Visit  Medication Sig Dispense Refill  . aspirin 81 MG EC tablet Take 81 mg by mouth daily.        . Calcium Carbonate-Vit D-Min (CALCIUM 1200 PO) Take by mouth daily.        . Cholecalciferol (VITAMIN D) 2000 UNITS CAPS Take by mouth daily.        Marland Kitchen esomeprazole (NEXIUM) 40 MG capsule Take 1 capsule (40 mg total) by mouth daily. As needed  90 capsule  3  . estradiol (ESTRACE VAGINAL) 0.1 MG/GM vaginal cream As directed      . rosuvastatin (CRESTOR) 20 MG tablet Take 1 tablet (20 mg total) by mouth daily.  90 tablet  3  . Testosterone Propionate (FIRST-TESTOSTERONE MC) 2 % CREA As needed      . valsartan (DIOVAN) 320 MG tablet Take 1 tablet (  320 mg total) by mouth daily. Take 1/2 if blood pressure is under 140  90 tablet  3    Review of Systems  Constitutional: Negative for diaphoresis and unexpected weight change.  HENT: Negative for tinnitus.   Eyes: Negative for photophobia and visual disturbance.  Respiratory: Negative for choking and stridor.   Gastrointestinal: Negative for vomiting and blood in stool.  Genitourinary: Negative for hematuria and decreased urine volume.  Musculoskeletal: Negative for gait problem.  Skin: Negative for color change and wound.  Neurological: Negative for tremors and numbness.  Psychiatric/Behavioral: Negative for decreased  concentration. The patient is not hyperactive.       Objective:   Physical Exam BP 134/80  Pulse 74  Temp 97.2 F (36.2 C) (Oral)  Ht 5\' 1"  (1.549 m)  Wt 171 lb 2 oz (77.622 kg)  BMI 32.33 kg/m2  SpO2 96% Physical Exam  VS noted, mild ill Constitutional: Pt appears well-developed and well-nourished.  HENT: Head: Normocephalic.  Right Ear: External ear normal.  Left Ear: External ear normal.  Bilat tm's mild erythema.  Sinus nontender.  Pharynx mild erythema Eyes: Conjunctivae and EOM are normal. Pupils are equal, round, and reactive to light.  Neck: Normal range of motion. Neck supple.  Cardiovascular: Normal rate and regular rhythm.   Pulmonary/Chest: Effort normal and breath sounds mild decreased, no rales or wheezing Neurological: Pt is alert. Not confused  Skin: Skin is warm. No erythema.  Psychiatric: Pt behavior is normal. Thought content normal. not nervous or depressed affect    Assessment & Plan:

## 2012-04-01 NOTE — Assessment & Plan Note (Signed)
Mild to mod, for antibx course,  to f/u any worsening symptoms or concerns 

## 2012-04-01 NOTE — Patient Instructions (Addendum)
Take all new medications as prescribed Continue all other medications as before Please have the pharmacy call with any other refills you may need Thank you for enrolling in MyChart. Please follow the instructions below to securely access your online medical record. MyChart allows you to send messages to your doctor, view your test results, renew your prescriptions, schedule appointments, and more. To Log into MyChart, please go to [email or web address], and your Username is: [email or web address]

## 2012-04-01 NOTE — Assessment & Plan Note (Signed)
stable overall by hx and exam, most recent data reviewed with pt, and pt to continue medical treatment as before Lab Results  Component Value Date   WBC 5.9 05/23/2011   HGB 12.9 05/23/2011   HCT 38.6 05/23/2011   PLT 211.0 05/23/2011   GLUCOSE 98 05/23/2011   CHOL 125 05/23/2011   TRIG 62.0 05/23/2011   HDL 61.30 05/23/2011   LDLCALC 51 05/23/2011   ALT 15 05/23/2011   AST 17 05/23/2011   NA 141 05/23/2011   K 4.3 05/23/2011   CL 108 05/23/2011   CREATININE 0.5 05/23/2011   BUN 16 05/23/2011   CO2 27 05/23/2011   TSH 1.72 05/23/2011   INR 0.9 05/24/2008

## 2012-04-01 NOTE — Assessment & Plan Note (Signed)
stable overall by hx and exam, most recent data reviewed with pt, and pt to continue medical treatment as before BP Readings from Last 3 Encounters:  04/01/12 134/80  10/08/11 144/80  06/05/11 144/60

## 2012-04-21 ENCOUNTER — Telehealth: Payer: Self-pay | Admitting: Internal Medicine

## 2012-04-21 NOTE — Telephone Encounter (Signed)
Patient Information:  Caller Name: Analysia  Phone: [phone]  Patient: Christina [name], Christina [name]  Gender: Female  DOB: 1934-06-05  Age: 78 Years  PCP: Oliver Barre (Adults only)  Office Follow Up:  Does the office need to follow up with this patient?: No  Instructions For The Office: N/A   Symptoms  Reason For Call & Symptoms: cough; pt was treated with Levaquin x 10 days and Hycodan cough syrup.  Cough has not improved.  Cough is still present and productive with yellow, green  phlegm  Reviewed Health History In EMR: Yes  Reviewed Medications In EMR: Yes  Reviewed Allergies In EMR: Yes  Reviewed Surgeries / Procedures: Yes  Date of Onset of Symptoms: 04/01/2012  Guideline(s) Used:  Cough  Disposition Per Guideline:   See Within 3 Days in Office  Reason For Disposition Reached:   Cough has been present for > 10 days  Advice Given:  N/A  Appointment Scheduled:  04/22/2012 08:15:00 Appointment Scheduled Provider:  Oliver Barre (Adults only)

## 2012-04-22 ENCOUNTER — Ambulatory Visit (INDEPENDENT_AMBULATORY_CARE_PROVIDER_SITE_OTHER): Payer: Medicare Other | Admitting: Internal Medicine

## 2012-04-22 ENCOUNTER — Encounter: Payer: Self-pay | Admitting: Internal Medicine

## 2012-04-22 VITALS — BP 140/80 | HR 66 | Temp 98.4°F | Ht 61.0 in | Wt 172.5 lb

## 2012-04-22 DIAGNOSIS — K219 Gastro-esophageal reflux disease without esophagitis: Secondary | ICD-10-CM

## 2012-04-22 DIAGNOSIS — R05 Cough: Secondary | ICD-10-CM

## 2012-04-22 DIAGNOSIS — I10 Essential (primary) hypertension: Secondary | ICD-10-CM

## 2012-04-22 DIAGNOSIS — J309 Allergic rhinitis, unspecified: Secondary | ICD-10-CM

## 2012-04-22 MED ORDER — ESOMEPRAZOLE MAGNESIUM 40 MG PO CPDR: 40.0000 mg | DELAYED_RELEASE_CAPSULE | Freq: Every day | ORAL | Status: AC

## 2012-04-22 MED ORDER — FEXOFENADINE HCL 180 MG PO TABS: 180.0000 mg | ORAL_TABLET | Freq: Every day | ORAL | Status: DC

## 2012-04-22 MED ORDER — METHYLPREDNISOLONE ACETATE 80 MG/ML IJ SUSP
120.0000 mg | Freq: Once | INTRAMUSCULAR | Status: AC
  Administered 2012-04-22: 120 mg via INTRAMUSCULAR

## 2012-04-22 MED ORDER — FLUTICASONE PROPIONATE 50 MCG/ACT NA SUSP: 2.0000 | Freq: Every day | NASAL | Status: AC

## 2012-04-22 MED ORDER — PREDNISONE 10 MG PO TABS: ORAL_TABLET | ORAL | Status: DC

## 2012-04-22 NOTE — Assessment & Plan Note (Addendum)
ECG reviewed as per emr, stable overall by history and exam, recent data reviewed with pt, and pt to continue medical treatment as before,  to f/u any worsening symptoms or concerns BP Readings from Last 3 Encounters:  04/22/12 140/80  04/01/12 134/80  10/08/11 144/80

## 2012-04-22 NOTE — Patient Instructions (Addendum)
Your EKG was OK today You had the steroid shot today Please take all new medication as prescribed - the flonase and allegra, and short course of low dose prednisone For now, please take the nexium daily Please continue all other medications as before, and refills have been done if requested. Please have the pharmacy call with any other refills you may need. After one month, you should try to cut back on one of your medications at a time to see if the cough returns, such as stopping the flonase, allegra and making the nexium as needed Please call or return if you would like the CXR done Please return in Feb 2014 as planned, or sooner if needed, with Lab testing done 3-5 days before

## 2012-04-22 NOTE — Progress Notes (Signed)
Subjective:    Patient ID: Christina [name], female    DOB: October 15, 1934, 78 y.o.   MRN: [id number]  HPI  Here with persistent cough since 12 days prior to dec 24 last visit with cough, ,may have been better with levaquin, but has had lots of family exposure during the holidays, some with illness/cough, and now cont's to be signficantly prod to her, yellowish/greensish, no fever.  Did try left over flonase which helped quite a bit in last 2 days.  Denies worsening reflux, abd pain, dysphagia, n/v, bowel change or blood. Pt denies chest pain, increased sob or doe, wheezing, orthopnea, PND, increased LE swelling, palpitations, dizziness or syncope.  Pt denies fever, wt loss, night sweats, loss of appetite, or other constitutional symptoms. Does not always take the nexium daily Past Medical History  Diagnosis Date  . ALLERGIC RHINITIS 12/31/2007  . ANXIETY 11/30/2006  . BAKER'S CYST, RIGHT KNEE 11/30/2006  . BURSITIS, LEFT HIP 12/31/2007  . Cardiomegaly 05/08/2010  . DEGENERATIVE JOINT DISEASE 11/30/2006  . GASTROENTERITIS WITHOUT DEHYDRATION 06/06/2007  . GERD 11/30/2006  . Headache 10/22/2008  . HYPERCHOLESTEROLEMIA 11/30/2006  . HYPERLIPIDEMIA 04/16/2007  . HYPERTENSION 11/30/2006  . OSTEOPOROSIS 10/22/2008  . PERIPHERAL VASCULAR DISEASE 04/16/2007  . 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 VITAMIN D DEFICIENCY 05/08/2010  . Anxiety   . Allergic rhinitis   . Hiatal hernia     with Schatzki's ring  . Diverticulosis   . DJD (degenerative joint disease) of knee     bilateral  . Hemorrhoids   . Carotid artery occlusion 2010   Past Surgical History  Procedure Date  . S/p right first finger t rigger finger   . S/p right cea feb 2010     Dr Hart Rochester  . Breast biopsy 1992    right  . Tonsillectomy and adenoidectomy   . Left ankle surgery     x 2  . Left great toe fusion   . Endometrial biopsy 1994  . Colonoscopy 2003; 11/2310    2003:External hemorrhoids 2012: diverticulosis, external hemorrhoids  .  Carotid endarterectomy 2010    reports that she has never smoked. She has never used smokeless tobacco. She reports that she drinks alcohol. She reports that she does not use illicit drugs. family history includes Anemia in her mother; Arthritis in her maternal grandmother; Breast cancer in her maternal aunt; Cirrhosis in an unspecified family member; Deep vein thrombosis in her father; Heart attack in an unspecified family member; Heart disease in her father; Hypertension in her father and mother; Leukemia in her paternal grandmother; Osteoporosis in her mother; Stroke in her father and mother; and Throat cancer in an unspecified family member.  There is no history of Colon cancer. Allergies  Allergen Reactions  . Hydromorphone Hcl   . Latex   . Sulfonamide Derivatives    Current Outpatient Prescriptions on File Prior to Visit  Medication Sig Dispense Refill  . aspirin 81 MG EC tablet Take 81 mg by mouth daily.        . Calcium Carbonate-Vit D-Min (CALCIUM 1200 PO) Take by mouth daily.        . Cholecalciferol (VITAMIN D) 2000 UNITS CAPS Take by mouth daily.        Marland Kitchen esomeprazole (NEXIUM) 40 MG capsule Take 1 capsule (40 mg total) by mouth daily. As needed  90 capsule  3  . estradiol (ESTRACE VAGINAL) 0.1 MG/GM vaginal cream As directed      . HYDROcodone-homatropine (HYCODAN) 5-1.5 MG/5ML syrup  Take 5 mLs by mouth every 6 (six) hours as needed for cough.  120 mL  1  . rosuvastatin (CRESTOR) 20 MG tablet Take 1 tablet (20 mg total) by mouth daily.  90 tablet  3  . Testosterone Propionate (FIRST-TESTOSTERONE MC) 2 % CREA As needed      . valsartan (DIOVAN) 320 MG tablet Take 1 tablet (320 mg total) by mouth daily. Take 1/2 if blood pressure is under 140  90 tablet  3  . fexofenadine (ALLEGRA) 180 MG tablet Take 1 tablet (180 mg total) by mouth daily.  30 tablet  3  . fluticasone (FLONASE) 50 MCG/ACT nasal spray Place 2 sprays into the nose daily.  16 g  4   Review of Systems  Constitutional:  Negative for unexpected weight change, or unusual diaphoresis  HENT: Negative for tinnitus.   Eyes: Negative for photophobia and visual disturbance.  Respiratory: Negative for choking and stridor.   Gastrointestinal: Negative for vomiting and blood in stool.  Genitourinary: Negative for hematuria and decreased urine volume.  Musculoskeletal: Negative for acute joint swelling Skin: Negative for color change and wound.  Neurological: Negative for tremors and numbness other than noted  Psychiatric/Behavioral: Negative for decreased concentration or  hyperactivity.       Objective:   Physical Exam BP 140/80  Pulse 66  Temp 98.4 F (36.9 C) (Oral)  Ht 5\' 1"  (1.549 m)  Wt 172 lb 8 oz (78.245 kg)  BMI 32.59 kg/m2  SpO2 94% VS noted, not ill appearing but occas cough Constitutional: Pt appears well-developed and well-nourished.  HENT: Head: NCAT.  Right Ear: External ear normal.  Left Ear: External ear normal.  Bilat tm's with mild erythema.  Max sinus areas mild tender.  Pharynx with mild erythema, no exudate Eyes: Conjunctivae and EOM are normal. Pupils are equal, round, and reactive to light.  Neck: Normal range of motion. Neck supple.  Cardiovascular: Normal rate and regular rhythm.   Pulmonary/Chest: Effort normal and breath sounds normal.  Abd:  Soft, NT, non-distended, + BS Neurological: Pt is alert. Not confused  Skin: Skin is warm. No erythema.  Psychiatric: Pt behavior is normal. Thought content normal.         Assessment & Plan:

## 2012-04-23 ENCOUNTER — Encounter: Payer: Self-pay | Admitting: Internal Medicine

## 2012-04-23 DIAGNOSIS — R05 Cough: Secondary | ICD-10-CM | POA: Insufficient documentation

## 2012-04-23 NOTE — Assessment & Plan Note (Signed)
Most likely etiology for cough today, to add flonase asd,  to f/u any worsening symptoms or concerns

## 2012-04-23 NOTE — Assessment & Plan Note (Signed)
Chronic, most likely related to post nasal gtt but hx and exam, for tx as above, declines cxr for now

## 2012-04-23 NOTE — Assessment & Plan Note (Signed)
.  asympt, less likely cause of cough, Please continue all other medications as before

## 2012-06-02 ENCOUNTER — Other Ambulatory Visit (INDEPENDENT_AMBULATORY_CARE_PROVIDER_SITE_OTHER): Payer: Medicare Other

## 2012-06-02 LAB — TSH: TSH: 2.71 u[IU]/mL (ref 0.35–5.50)

## 2012-06-02 LAB — HEPATIC FUNCTION PANEL
ALT: 19 U/L (ref 0–35)
AST: 16 U/L (ref 0–37)
Total Bilirubin: 0.5 mg/dL (ref 0.3–1.2)

## 2012-06-02 LAB — URINALYSIS, ROUTINE W REFLEX MICROSCOPIC
Bilirubin Urine: NEGATIVE
Total Protein, Urine: NEGATIVE
Urine Glucose: NEGATIVE

## 2012-06-02 LAB — CBC WITH DIFFERENTIAL/PLATELET
Eosinophils Relative: 1.4 % (ref 0.0–5.0)
HCT: 37.5 % (ref 36.0–46.0)
Hemoglobin: 12.6 g/dL (ref 12.0–15.0)
Lymphs Abs: 1.7 10*3/uL (ref 0.7–4.0)
Monocytes Relative: 8.2 % (ref 3.0–12.0)
Neutro Abs: 4.6 10*3/uL (ref 1.4–7.7)
RBC: 4.07 Mil/uL (ref 3.87–5.11)
WBC: 7 10*3/uL (ref 4.5–10.5)

## 2012-06-02 LAB — BASIC METABOLIC PANEL
Calcium: 9.3 mg/dL (ref 8.4–10.5)
GFR: 97.65 mL/min (ref >60.00)
Glucose, Bld: 80 mg/dL (ref 70–99)
Sodium: 142 mEq/L (ref 135–145)

## 2012-06-02 LAB — LIPID PANEL
Cholesterol: 155 mg/dL (ref 0–200)
LDL Cholesterol: 71 mg/dL (ref 0–99)
VLDL: 15.8 mg/dL (ref 0.0–40.0)

## 2012-06-03 ENCOUNTER — Encounter: Payer: Self-pay | Admitting: Internal Medicine

## 2012-06-03 ENCOUNTER — Ambulatory Visit (INDEPENDENT_AMBULATORY_CARE_PROVIDER_SITE_OTHER): Payer: Medicare Other | Admitting: Internal Medicine

## 2012-06-03 VITALS — BP 132/80 | HR 78 | Temp 98.0°F | Ht 61.5 in | Wt 173.5 lb

## 2012-06-03 DIAGNOSIS — Z0000 Encounter for general adult medical examination without abnormal findings: Secondary | ICD-10-CM

## 2012-06-03 DIAGNOSIS — M79609 Pain in unspecified limb: Secondary | ICD-10-CM

## 2012-06-03 MED ORDER — VALSARTAN 320 MG PO TABS: 320.0000 mg | ORAL_TABLET | Freq: Every day | ORAL | Status: DC

## 2012-06-03 MED ORDER — ROSUVASTATIN CALCIUM 20 MG PO TABS: 20.0000 mg | ORAL_TABLET | Freq: Every day | ORAL | Status: DC

## 2012-06-03 NOTE — Assessment & Plan Note (Signed)

## 2012-06-03 NOTE — Patient Instructions (Signed)
Please continue all other medications as before, and refills have been done if requested. You can also take the calcium supps, vit d and omega 3 OK to stop the Nexium, or use as needed only Please have the pharmacy call with any other refills you may need. Please remember to followup with your GYN for the yearly pap smear and/or mammogram You are otherwise up to date with prevention measures today. Your lab work was [employer] will be contacted regarding the referral for: orthopedic - Dr Victorino Dike Please return in 1 year for your yearly visit, or sooner if needed, with Lab testing done 3-5 days before

## 2012-06-03 NOTE — Progress Notes (Signed)
Subjective:    Patient ID: Christina [name], female    DOB: [birth year], 78 y.o.   MRN: [id number]  HPI  Here for wellness and f/u;  Overall doing ok;  Pt denies CP, worsening SOB, DOE, wheezing, orthopnea, PND, worsening LE edema, palpitations, dizziness or syncope.  Pt denies neurological change such as new headache, facial or extremity weakness.  Pt denies polydipsia, polyuria, or low sugar symptoms. Pt states overall good compliance with treatment and medications, good tolerability, and has been trying to follow lower cholesterol diet.  Pt denies worsening depressive symptoms, suicidal ideation or panic. No fever, night sweats, wt loss, loss of appetite, or other constitutional symptoms.  Pt states good ability with ADL's, has low fall risk, home safety reviewed and adequate, no other significant changes in hearing or vision, and only occasionally active with exercise.   Due for mar 4 carotid doppler f/u.  Taking [employer] class locally for exercise and diet, not gaining wt, wt stable.    Denies worsening reflux, abd pain, dysphagia, n/v, bowel change or blood, only takes the nexium as needed, and cough has not recurred so far.  Allegra and flonase seeemed to have helped the most, has no dysphagia but feels a "lumpiness" to the back of the throat that was worse when had to go to [facility] recently but now improved again.   Also has some left dorsal foot pain chronic ongoing, asks to see ortho Past Medical History  Diagnosis Date  . ALLERGIC RHINITIS 12/31/2007  . ANXIETY 11/30/2006  . BAKER'S CYST, RIGHT KNEE 11/30/2006  . BURSITIS, LEFT HIP 12/31/2007  . Cardiomegaly 05/08/2010  . DEGENERATIVE JOINT DISEASE 11/30/2006  . GASTROENTERITIS WITHOUT DEHYDRATION 06/06/2007  . GERD 11/30/2006  . Headache 10/22/2008  . HYPERCHOLESTEROLEMIA 11/30/2006  . HYPERLIPIDEMIA 04/16/2007  . HYPERTENSION 11/30/2006  . OSTEOPOROSIS 10/22/2008  . PERIPHERAL VASCULAR DISEASE 04/16/2007  . POST-POLIO SYNDROME 11/30/2006   . VITAMIN D DEFICIENCY 05/08/2010  . Anxiety   . Allergic rhinitis   . Hiatal hernia     with Schatzki's ring  . Diverticulosis   . DJD (degenerative joint disease) of knee     bilateral  . Hemorrhoids   . Carotid artery occlusion 2010   Past Surgical History  Procedure Laterality Date  . S/p right first finger t rigger finger    . S/p right cea feb 2010      Dr Hart Rochester  . Breast biopsy  1992    right  . Tonsillectomy and adenoidectomy    . Left ankle surgery      x 2  . Left great toe fusion    . Endometrial biopsy  1994  . Colonoscopy  2003; 11/2310    2003:External hemorrhoids 2012: diverticulosis, external hemorrhoids  . Carotid endarterectomy  2010    reports that she has never smoked. She has never used smokeless tobacco. She reports that  drinks alcohol. She reports that she does not use illicit drugs. family history includes Anemia in her mother; Arthritis in her maternal grandmother; Breast cancer in her maternal aunt; Cirrhosis in an unspecified family member; Deep vein thrombosis in her father; Heart attack in an unspecified family member; Heart disease in her father; Hypertension in her father and mother; Leukemia in her paternal grandmother; Osteoporosis in her mother; Stroke in her father and mother; and Throat cancer in an unspecified family member.  There is no history of Colon cancer. Allergies  Allergen Reactions  . Hydromorphone Hcl   .  Latex   . Sulfonamide Derivatives    Current Outpatient Prescriptions on File Prior to Visit  Medication Sig Dispense Refill  . aspirin 81 MG EC tablet Take 81 mg by mouth daily.        . Calcium Carbonate-Vit D-Min (CALCIUM 1200 PO) Take by mouth daily.        . Cholecalciferol (VITAMIN D) 2000 UNITS CAPS Take by mouth daily.        Marland Kitchen esomeprazole (NEXIUM) 40 MG capsule Take 1 capsule (40 mg total) by mouth daily. As needed  90 capsule  3  . estradiol (ESTRACE VAGINAL) 0.1 MG/GM vaginal cream As directed      . fexofenadine  (ALLEGRA) 180 MG tablet Take 1 tablet (180 mg total) by mouth daily.  30 tablet  3  . fluticasone (FLONASE) 50 MCG/ACT nasal spray Place 2 sprays into the nose daily.  16 g  4  . rosuvastatin (CRESTOR) 20 MG tablet Take 1 tablet (20 mg total) by mouth daily.  90 tablet  3  . Testosterone Propionate (FIRST-TESTOSTERONE MC) 2 % CREA As needed      . valsartan (DIOVAN) 320 MG tablet Take 1 tablet (320 mg total) by mouth daily. Take 1/2 if blood pressure is under 140  90 tablet  3   No current facility-administered medications on file prior to visit.   Review of Systems Constitutional: Negative for diaphoresis, activity change, appetite change or unexpected weight change.  HENT: Negative for hearing loss, ear pain, facial swelling, mouth sores and neck stiffness.   Eyes: Negative for pain, redness and visual disturbance.  Respiratory: Negative for shortness of breath and wheezing.   Cardiovascular: Negative for chest pain and palpitations.  Gastrointestinal: Negative for diarrhea, blood in stool, abdominal distention or other pain Genitourinary: Negative for hematuria, flank pain or change in urine volume.  Musculoskeletal: Negative for myalgias and joint swelling.  Skin: Negative for color change and wound.  Neurological: Negative for syncope and numbness. other than noted Hematological: Negative for adenopathy.  Psychiatric/Behavioral: Negative for hallucinations, self-injury, decreased concentration and agitation.      Objective:   Physical Exam BP 132/80  Pulse 78  Temp(Src) 98 F (36.7 C) (Oral)  Ht 5' 1.5" (1.562 m)  Wt 173 lb 8 oz (78.699 kg)  BMI 32.26 kg/m2  SpO2 96% VS noted,  Constitutional: Pt is oriented to person, place, and time. Appears well-developed and well-nourished.  Head: Normocephalic and atraumatic.  Right Ear: External ear normal.  Left Ear: External ear normal.  Nose: Nose normal.  Mouth/Throat: Oropharynx is clear and moist.  Eyes: Conjunctivae and EOM  are normal. Pupils are equal, round, and reactive to light.  Neck: Normal range of motion. Neck supple. No JVD present. No tracheal deviation present.  Cardiovascular: Normal rate, regular rhythm, normal heart sounds and intact distal pulses.   Pulmonary/Chest: Effort normal and breath sounds normal.  Abdominal: Soft. Bowel sounds are normal. There is no tenderness. No HSM  Musculoskeletal: Normal range of motion. Exhibits no edema.  Lymphadenopathy:  Has no cervical adenopathy.  Neurological: Pt is alert and oriented to person, place, and time. Pt has normal reflexes. No cranial nerve deficit.  Skin: Skin is warm and dry. No rash noted.  Psychiatric:  Has  normal mood and affect. Behavior is normal.     Assessment & Plan:

## 2012-06-03 NOTE — Assessment & Plan Note (Signed)
For ortho referral 

## 2012-06-09 ENCOUNTER — Encounter: Payer: Self-pay | Admitting: Vascular Surgery

## 2012-06-10 ENCOUNTER — Other Ambulatory Visit (INDEPENDENT_AMBULATORY_CARE_PROVIDER_SITE_OTHER): Payer: Medicare Other | Admitting: *Deleted

## 2012-06-10 ENCOUNTER — Ambulatory Visit (INDEPENDENT_AMBULATORY_CARE_PROVIDER_SITE_OTHER): Payer: Medicare Other | Admitting: Neurosurgery

## 2012-06-10 ENCOUNTER — Encounter: Payer: Self-pay | Admitting: Neurosurgery

## 2012-06-10 NOTE — Progress Notes (Signed)
[facility] Carotid Office Note  CC: Carotid surveillance Referring Physician: Hart Rochester  History of Present Illness: 78 year old female patient of Dr. Hart Rochester status post right CEA in 2010. The patient denies any signs or symptoms of CVA, TIA, amaurosis fugax. The patient denies any new medical diagnoses or recent surgery.  Past Medical History  Diagnosis Date  . ALLERGIC RHINITIS 12/31/2007  . ANXIETY 11/30/2006  . BAKER'S CYST, RIGHT KNEE 11/30/2006  . BURSITIS, LEFT HIP 12/31/2007  . Cardiomegaly 05/08/2010  . DEGENERATIVE JOINT DISEASE 11/30/2006  . GASTROENTERITIS WITHOUT DEHYDRATION 06/06/2007  . GERD 11/30/2006  . Headache 10/22/2008  . HYPERCHOLESTEROLEMIA 11/30/2006  . HYPERLIPIDEMIA 04/16/2007  . HYPERTENSION 11/30/2006  . OSTEOPOROSIS 10/22/2008  . PERIPHERAL VASCULAR DISEASE 04/16/2007  . 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 VITAMIN D DEFICIENCY 05/08/2010  . Anxiety   . Allergic rhinitis   . Hiatal hernia     with Schatzki's ring  . Diverticulosis   . DJD (degenerative joint disease) of knee     bilateral  . Hemorrhoids   . Carotid artery occlusion 2010    ROS: [x]  Positive   [ ]  Denies    General: [ ]  Weight loss, [ ]  Fever, [ ]  chills Neurologic: [ ]  Dizziness, [ ]  Blackouts, [ ]  Seizure [ ]  Stroke, [ ]  "Mini stroke", [ ]  Slurred speech, [ ]  Temporary blindness; [ ]  weakness in arms or legs, [ ]  Hoarseness Cardiac: [ ]  Chest pain/pressure, [ ]  Shortness of breath at rest [ ]  Shortness of breath with exertion, [ ]  Atrial fibrillation or irregular heartbeat Vascular: [ ]  Pain in legs with walking, [ ]  Pain in legs at rest, [ ]  Pain in legs at night,  [ ]  Non-healing ulcer, [ ]  Blood clot in vein/DVT,   Pulmonary: [ ]  Home oxygen, [ ]  Productive cough, [ ]  Coughing up blood, [ ]  Asthma,  [ ]  Wheezing Musculoskeletal:  [ ]  Arthritis, [ ]  Low back pain, [ ]  Joint pain Hematologic: [ ]  Easy Bruising, [ ]  Anemia; [ ]  Hepatitis Gastrointestinal: [ ]  Blood  in stool, [ ]  Gastroesophageal Reflux/heartburn, [ ]  Trouble swallowing Urinary: [ ]  chronic Kidney disease, [ ]  on HD - [ ]  MWF or [ ]  TTHS, [ ]  Burning with urination, [ ]  Difficulty urinating Skin: [ ]  Rashes, [ ]  Wounds Psychological: [ ]  Anxiety, [ ]  Depression   Social History History  Substance Use Topics  . Smoking status: Never Smoker   . Smokeless tobacco: Never Used  . Alcohol Use: Yes     Comment: occ glass of wine     Family History Family History  Problem Relation Age of Onset  . Stroke Father     mother, MGM  . Hypertension Father     mother, MGM  . Deep vein thrombosis Father   . Heart disease Father   . Osteoporosis Mother   . Anemia Mother   . Hypertension Mother   . Stroke Mother   . Leukemia Paternal Grandmother     MGF  . Heart attack      uncle  . Cirrhosis      uncle  . Throat cancer      uncle  . Breast cancer Maternal Aunt   . Arthritis Maternal Grandmother   . Colon cancer Neg Hx     Allergies  Allergen Reactions  . Hydromorphone Hcl   . Latex   . Sulfonamide Derivatives     Current Outpatient Prescriptions  Medication Sig Dispense Refill  . aspirin 81 MG EC tablet Take 81 mg by mouth daily.        . Calcium Carbonate-Vit D-Min (CALCIUM 1200 PO) Take by mouth daily.        . Cholecalciferol (VITAMIN D) 2000 UNITS CAPS Take by mouth daily.        Marland Kitchen esomeprazole (NEXIUM) 40 MG capsule Take 1 capsule (40 mg total) by mouth daily. As needed  90 capsule  3  . estradiol (ESTRACE VAGINAL) 0.1 MG/GM vaginal cream As directed      . fexofenadine (ALLEGRA) 180 MG tablet Take 1 tablet (180 mg total) by mouth daily.  30 tablet  3  . fluticasone (FLONASE) 50 MCG/ACT nasal spray Place 2 sprays into the nose daily.  16 g  4  . rosuvastatin (CRESTOR) 20 MG tablet Take 1 tablet (20 mg total) by mouth daily.  90 tablet  3  . Testosterone Propionate (FIRST-TESTOSTERONE MC) 2 % CREA As needed      . valsartan (DIOVAN) 320 MG tablet Take 1 tablet (320  mg total) by mouth daily. Take 1/2 if blood pressure is under 140  90 tablet  3   No current facility-administered medications for this visit.    Physical Examination  Filed Vitals:   06/10/12 1353  BP: 149/70  Pulse:     Body mass index is 31.79 kg/(m^2).  General:  WDWN in NAD Gait: Normal HEENT: WNL Eyes: Pupils equal Pulmonary: normal non-labored breathing , without Rales, rhonchi,  wheezing Cardiac: RRR, without  Murmurs, rubs or gallops; Abdomen: soft, NT, no masses Skin: no rashes, ulcers noted  Vascular Exam Pulses: 3+ radial pulses bilaterally Carotid bruits: Carotid pulses to auscultation no bruits are heard Extremities without ischemic changes, no Gangrene , no cellulitis; no open wounds;  Musculoskeletal: no muscle wasting or atrophy   Neurologic: A&O X 3; Appropriate Affect ; SENSATION: normal; MOTOR FUNCTION:  moving all extremities equally. Speech is fluent/normal  Non-Invasive Vascular Imaging CAROTID DUPLEX 06/10/2012  Right ICA 0 - 19% stenosis Left ICA 20 - 39 % stenosis   ASSESSMENT/PLAN: Asymptomatic patient with stable carotid duplex one year ago. The patient will followup in one year with repeat carotid duplex. The patient's questions were encouraged and answered, she is in agreement with this plan.  Lauree Chandler ANP   Clinic MD: Hart Rochester

## 2012-06-11 NOTE — Addendum Note (Signed)
Addended by: Sharee Pimple on: 06/11/2012 11:27 AM   Modules accepted: Orders

## 2012-07-08 ENCOUNTER — Other Ambulatory Visit: Payer: Self-pay

## 2012-07-08 DIAGNOSIS — Z1231 Encounter for screening mammogram for malignant neoplasm of breast: Secondary | ICD-10-CM

## 2012-07-24 ENCOUNTER — Telehealth: Payer: Self-pay | Admitting: Internal Medicine

## 2012-07-24 MED ORDER — NYSTATIN 100000 UNIT/ML MT SUSP: 500000.0000 [IU] | Freq: Four times a day (QID) | OROMUCOSAL | Status: DC

## 2012-07-24 NOTE — Telephone Encounter (Signed)
Christina [name] States she has developed a white coating on her tongue, as welll as soreness, with using nasal spray, could this be causing the thrush?  calling for recommendations.

## 2012-07-24 NOTE — Telephone Encounter (Signed)
Patient informed rx sent in to [facility] [place]

## 2012-07-24 NOTE — Telephone Encounter (Signed)
Yes, she is likely correct  Ok for nystatin soln  - done erx

## 2012-07-24 NOTE — Telephone Encounter (Signed)
Patient informed. 

## 2012-08-19 ENCOUNTER — Ambulatory Visit
Admission: RE | Admit: 2012-08-19 | Discharge: 2012-08-19 | Disposition: A | Discharge status: Home / Self Care | Payer: Medicare Other | Source: Ambulatory Visit

## 2012-08-19 DIAGNOSIS — Z1231 Encounter for screening mammogram for malignant neoplasm of breast: Secondary | ICD-10-CM

## 2012-08-21 ENCOUNTER — Encounter (HOSPITAL_BASED_OUTPATIENT_CLINIC_OR_DEPARTMENT_OTHER): Payer: Self-pay | Admitting: *Deleted

## 2012-08-21 NOTE — Progress Notes (Signed)
Pt will come in for bmet-very nice and interesting lady Had polio as child lt fot smaller-has a bone spur and hurts to use special shoe

## 2012-08-25 ENCOUNTER — Encounter (HOSPITAL_BASED_OUTPATIENT_CLINIC_OR_DEPARTMENT_OTHER)
Admission: RE | Admit: 2012-08-25 | Discharge: 2012-08-25 | Disposition: A | Discharge status: Home / Self Care | Payer: Medicare Other | Source: Ambulatory Visit | Attending: Orthopedic Surgery | Admitting: Orthopedic Surgery

## 2012-08-25 LAB — BASIC METABOLIC PANEL
BUN: 14 mg/dL (ref 6–23)
Creatinine, Ser: 0.61 mg/dL (ref 0.50–1.10)
GFR calc Af Amer: >90 mL/min (ref >90)
GFR calc non Af Amer: 86 mL/min — ABNORMAL LOW (ref >90)
Glucose, Bld: 90 mg/dL (ref 70–99)

## 2012-08-25 NOTE — Progress Notes (Addendum)
Pt requesting to share information regarding anesthesia and her previous history of polio.  Dr Chaney Malling in to speak with pt. Records from previous surgical event 05/2008 with Dr Hart Rochester to be obtained and placed on chart for review on the day of Surgery. 08/25/2012 1415 Anesthesia record from 05/26/2008 received from [facility] and place on chart.

## 2012-08-27 ENCOUNTER — Other Ambulatory Visit: Payer: Self-pay | Admitting: Orthopedic Surgery

## 2012-08-28 ENCOUNTER — Encounter (HOSPITAL_BASED_OUTPATIENT_CLINIC_OR_DEPARTMENT_OTHER): Payer: Self-pay | Admitting: Anesthesiology

## 2012-08-28 ENCOUNTER — Encounter (HOSPITAL_BASED_OUTPATIENT_CLINIC_OR_DEPARTMENT_OTHER)
Admission: RE | Disposition: A | Discharge status: Home / Self Care | Payer: Self-pay | Source: Ambulatory Visit | Attending: Orthopedic Surgery

## 2012-08-28 ENCOUNTER — Encounter (HOSPITAL_BASED_OUTPATIENT_CLINIC_OR_DEPARTMENT_OTHER): Payer: Self-pay | Admitting: *Deleted

## 2012-08-28 ENCOUNTER — Ambulatory Visit (HOSPITAL_BASED_OUTPATIENT_CLINIC_OR_DEPARTMENT_OTHER): Payer: Medicare Other | Admitting: Anesthesiology

## 2012-08-28 ENCOUNTER — Ambulatory Visit (HOSPITAL_BASED_OUTPATIENT_CLINIC_OR_DEPARTMENT_OTHER)
Admission: RE | Admit: 2012-08-28 | Discharge: 2012-08-28 | Disposition: A | Discharge status: Home / Self Care | Payer: Medicare Other | Source: Ambulatory Visit | Attending: Orthopedic Surgery | Admitting: Orthopedic Surgery

## 2012-08-28 DIAGNOSIS — K5730 Diverticulosis of large intestine without perforation or abscess without bleeding: Secondary | ICD-10-CM | POA: Insufficient documentation

## 2012-08-28 DIAGNOSIS — Z882 Allergy status to sulfonamides status: Secondary | ICD-10-CM | POA: Insufficient documentation

## 2012-08-28 DIAGNOSIS — Z79899 Other long term (current) drug therapy: Secondary | ICD-10-CM | POA: Insufficient documentation

## 2012-08-28 DIAGNOSIS — M898X9 Other specified disorders of bone, unspecified site: Secondary | ICD-10-CM

## 2012-08-28 DIAGNOSIS — E7800 Pure hypercholesterolemia, unspecified: Secondary | ICD-10-CM | POA: Insufficient documentation

## 2012-08-28 DIAGNOSIS — B91 Sequelae of poliomyelitis: Secondary | ICD-10-CM | POA: Insufficient documentation

## 2012-08-28 DIAGNOSIS — J309 Allergic rhinitis, unspecified: Secondary | ICD-10-CM | POA: Insufficient documentation

## 2012-08-28 DIAGNOSIS — I10 Essential (primary) hypertension: Secondary | ICD-10-CM | POA: Insufficient documentation

## 2012-08-28 DIAGNOSIS — I739 Peripheral vascular disease, unspecified: Secondary | ICD-10-CM | POA: Insufficient documentation

## 2012-08-28 DIAGNOSIS — Z91040 Latex allergy status: Secondary | ICD-10-CM | POA: Insufficient documentation

## 2012-08-28 DIAGNOSIS — F411 Generalized anxiety disorder: Secondary | ICD-10-CM | POA: Insufficient documentation

## 2012-08-28 DIAGNOSIS — E785 Hyperlipidemia, unspecified: Secondary | ICD-10-CM | POA: Insufficient documentation

## 2012-08-28 DIAGNOSIS — M810 Age-related osteoporosis without current pathological fracture: Secondary | ICD-10-CM | POA: Insufficient documentation

## 2012-08-28 DIAGNOSIS — K219 Gastro-esophageal reflux disease without esophagitis: Secondary | ICD-10-CM | POA: Insufficient documentation

## 2012-08-28 DIAGNOSIS — I6529 Occlusion and stenosis of unspecified carotid artery: Secondary | ICD-10-CM | POA: Insufficient documentation

## 2012-08-28 DIAGNOSIS — Z7982 Long term (current) use of aspirin: Secondary | ICD-10-CM | POA: Insufficient documentation

## 2012-08-28 DIAGNOSIS — Z885 Allergy status to narcotic agent status: Secondary | ICD-10-CM | POA: Insufficient documentation

## 2012-08-28 DIAGNOSIS — M1990 Unspecified osteoarthritis, unspecified site: Secondary | ICD-10-CM | POA: Insufficient documentation

## 2012-08-28 DIAGNOSIS — M259 Joint disorder, unspecified: Secondary | ICD-10-CM | POA: Insufficient documentation

## 2012-08-28 HISTORY — DX: Other complications of anesthesia, initial encounter: T88.59XA

## 2012-08-28 HISTORY — PX: CALCANEAL OSTEOTOMY: SHX1281

## 2012-08-28 HISTORY — DX: Adverse effect of unspecified anesthetic, initial encounter: T41.45XA

## 2012-08-28 SURGERY — OSTEOTOMY, CALCANEUS
Anesthesia: General | Site: Foot | Laterality: Left | Wound class: Clean

## 2012-08-28 MED ORDER — LACTATED RINGERS IV SOLN
INTRAVENOUS | Status: DC
  Administered 2012-08-28 (×2): via INTRAVENOUS

## 2012-08-28 MED ORDER — HYDROCODONE-ACETAMINOPHEN 5-325 MG PO TABS: 1.0000 | ORAL_TABLET | Freq: Four times a day (QID) | ORAL | Status: DC | PRN

## 2012-08-28 MED ORDER — ONDANSETRON HCL 4 MG/2ML IJ SOLN
INTRAMUSCULAR | Status: DC | PRN
  Administered 2012-08-28: 4 mg via INTRAVENOUS

## 2012-08-28 MED ORDER — CHLORHEXIDINE GLUCONATE 4 % EX LIQD: 60.0000 mL | Freq: Once | CUTANEOUS | Status: DC

## 2012-08-28 MED ORDER — ONDANSETRON HCL 4 MG/2ML IJ SOLN: 4.0000 mg | Freq: Once | INTRAMUSCULAR | Status: DC | PRN

## 2012-08-28 MED ORDER — FENTANYL CITRATE 0.05 MG/ML IJ SOLN: 50.0000 ug | INTRAMUSCULAR | Status: DC | PRN

## 2012-08-28 MED ORDER — PROPOFOL 10 MG/ML IV BOLUS
INTRAVENOUS | Status: DC | PRN
  Administered 2012-08-28: 150 mg via INTRAVENOUS
  Administered 2012-08-28: 50 mg via INTRAVENOUS

## 2012-08-28 MED ORDER — BACITRACIN ZINC 500 UNIT/GM EX OINT
TOPICAL_OINTMENT | CUTANEOUS | Status: DC | PRN
  Administered 2012-08-28: 1 via TOPICAL

## 2012-08-28 MED ORDER — LIDOCAINE HCL (CARDIAC) 20 MG/ML IV SOLN
INTRAVENOUS | Status: DC | PRN
  Administered 2012-08-28: 50 mg via INTRAVENOUS

## 2012-08-28 MED ORDER — MIDAZOLAM HCL 2 MG/2ML IJ SOLN: 1.0000 mg | INTRAMUSCULAR | Status: DC | PRN

## 2012-08-28 MED ORDER — FENTANYL CITRATE 0.05 MG/ML IJ SOLN
INTRAMUSCULAR | Status: DC | PRN
  Administered 2012-08-28: 100 ug via INTRAVENOUS

## 2012-08-28 MED ORDER — DEXAMETHASONE SODIUM PHOSPHATE 4 MG/ML IJ SOLN
INTRAMUSCULAR | Status: DC | PRN
  Administered 2012-08-28: 10 mg via INTRAVENOUS

## 2012-08-28 MED ORDER — FENTANYL CITRATE 0.05 MG/ML IJ SOLN
25.0000 ug | INTRAMUSCULAR | Status: DC | PRN
  Administered 2012-08-28: 25 ug via INTRAVENOUS

## 2012-08-28 MED ORDER — CEFAZOLIN SODIUM-DEXTROSE 2-3 GM-% IV SOLR
2.0000 g | INTRAVENOUS | Status: AC
  Administered 2012-08-28: 2 g via INTRAVENOUS

## 2012-08-28 MED ORDER — SODIUM CHLORIDE 0.9 % IV SOLN: INTRAVENOUS | Status: DC

## 2012-08-28 SURGICAL SUPPLY — 70 items
BANDAGE ELASTIC 4 VELCRO ST LF (GAUZE/BANDAGES/DRESSINGS) ×2 IMPLANT
BANDAGE ESMARK 6X9 LF (GAUZE/BANDAGES/DRESSINGS) ×1 IMPLANT
BANDAGE GAUZE ELAST BULKY 4 IN (GAUZE/BANDAGES/DRESSINGS) IMPLANT
BLADE AVERAGE 25X9 (BLADE) IMPLANT
BLADE MINI RND TIP GREEN BEAV (BLADE) IMPLANT
BLADE OSC/SAG .038X5.5 CUT EDG (BLADE) IMPLANT
BLADE SURG 15 STRL LF DISP TIS (BLADE) ×2 IMPLANT
BLADE SURG 15 STRL SS (BLADE) ×4
BNDG CMPR 9X4 STRL LF SNTH (GAUZE/BANDAGES/DRESSINGS)
BNDG COHESIVE 4X5 TAN STRL (GAUZE/BANDAGES/DRESSINGS) IMPLANT
BNDG COHESIVE 6X5 TAN STRL LF (GAUZE/BANDAGES/DRESSINGS) IMPLANT
BNDG ESMARK 4X9 LF (GAUZE/BANDAGES/DRESSINGS) IMPLANT
BNDG ESMARK 6X9 LF (GAUZE/BANDAGES/DRESSINGS) ×2
CHLORAPREP W/TINT 26ML (MISCELLANEOUS) ×2 IMPLANT
CLOTH BEACON ORANGE TIMEOUT ST (SAFETY) ×2 IMPLANT
COVER TABLE BACK 60X90 (DRAPES) ×2 IMPLANT
CUFF TOURNIQUET SINGLE 18IN (TOURNIQUET CUFF) IMPLANT
DRAPE EXTREMITY T 121X128X90 (DRAPE) ×2 IMPLANT
DRAPE OEC MINIVIEW 54X84 (DRAPES) ×2 IMPLANT
DRAPE SURG 17X23 STRL (DRAPES) ×2 IMPLANT
DRSG EMULSION OIL 3X3 NADH (GAUZE/BANDAGES/DRESSINGS) ×4 IMPLANT
DRSG PAD ABDOMINAL 8X10 ST (GAUZE/BANDAGES/DRESSINGS) ×2 IMPLANT
ELECT REM PT RETURN 9FT ADLT (ELECTROSURGICAL) ×2
ELECTRODE REM PT RTRN 9FT ADLT (ELECTROSURGICAL) ×1 IMPLANT
GAUZE SPONGE 4X4 16PLY XRAY LF (GAUZE/BANDAGES/DRESSINGS) IMPLANT
GAUZE XEROFORM 1X8 LF (GAUZE/BANDAGES/DRESSINGS) IMPLANT
GLOVE BIO SURGEON STRL SZ8 (GLOVE) IMPLANT
GLOVE BIOGEL PI IND STRL 6.5 (GLOVE) ×1 IMPLANT
GLOVE BIOGEL PI IND STRL 8 (GLOVE) ×3 IMPLANT
GLOVE BIOGEL PI INDICATOR 6.5 (GLOVE) ×1
GLOVE BIOGEL PI INDICATOR 8 (GLOVE) ×3
GLOVE EXAM NITRILE MD LF STRL (GLOVE) ×2 IMPLANT
GLOVE SURG SS PI 6.5 STRL IVOR (GLOVE) ×2 IMPLANT
GLOVE SURG SS PI 8.0 STRL IVOR (GLOVE) ×4 IMPLANT
GOWN PREVENTION PLUS XLARGE (GOWN DISPOSABLE) ×2 IMPLANT
GOWN PREVENTION PLUS XXLARGE (GOWN DISPOSABLE) ×2 IMPLANT
NEEDLE HYPO 22GX1.5 SAFETY (NEEDLE) IMPLANT
NEEDLE HYPO 25X1 1.5 SAFETY (NEEDLE) IMPLANT
NS IRRIG 1000ML POUR BTL (IV SOLUTION) ×2 IMPLANT
PACK BASIN DAY SURGERY FS (CUSTOM PROCEDURE TRAY) ×2 IMPLANT
PAD CAST 4YDX4 CTTN HI CHSV (CAST SUPPLIES) ×1 IMPLANT
PADDING CAST ABS 4INX4YD NS (CAST SUPPLIES) ×1
PADDING CAST ABS COTTON 4X4 ST (CAST SUPPLIES) ×1 IMPLANT
PADDING CAST COTTON 4X4 STRL (CAST SUPPLIES) ×2
PADDING CAST COTTON 6X4 STRL (CAST SUPPLIES) IMPLANT
PENCIL BUTTON HOLSTER BLD 10FT (ELECTRODE) ×2 IMPLANT
SANITIZER HAND PURELL 535ML FO (MISCELLANEOUS) ×2 IMPLANT
SHEET MEDIUM DRAPE 40X70 STRL (DRAPES) ×2 IMPLANT
SPLINT FAST PLASTER 5X30 (CAST SUPPLIES)
SPLINT PLASTER CAST FAST 5X30 (CAST SUPPLIES) IMPLANT
SPONGE GAUZE 4X4 12PLY (GAUZE/BANDAGES/DRESSINGS) ×2 IMPLANT
SPONGE LAP 18X18 X RAY DECT (DISPOSABLE) ×2 IMPLANT
STOCKINETTE 6  STRL (DRAPES) ×1
STOCKINETTE 6 STRL (DRAPES) ×1 IMPLANT
STRIP CLOSURE SKIN 1/2X4 (GAUZE/BANDAGES/DRESSINGS) ×2 IMPLANT
SUCTION FRAZIER TIP 10 FR DISP (SUCTIONS) IMPLANT
SUT ETHILON 3 0 FSL (SUTURE) IMPLANT
SUT ETHILON 4 0 PS 2 18 (SUTURE) IMPLANT
SUT MNCRL AB 4-0 PS2 18 (SUTURE) ×2 IMPLANT
SUT VIC AB 0 SH 27 (SUTURE) IMPLANT
SUT VIC AB 2-0 PS2 27 (SUTURE) ×2 IMPLANT
SUT VIC AB 3-0 PS1 18 (SUTURE)
SUT VIC AB 3-0 PS1 18XBRD (SUTURE) IMPLANT
SUT VICRYL 4-0 PS2 18IN ABS (SUTURE) IMPLANT
SYR BULB 3OZ (MISCELLANEOUS) ×2 IMPLANT
SYR CONTROL 10ML LL (SYRINGE) IMPLANT
TOWEL OR 17X24 6PK STRL BLUE (TOWEL DISPOSABLE) ×2 IMPLANT
TUBE CONNECTING 20X1/4 (TUBING) IMPLANT
UNDERPAD 30X30 INCONTINENT (UNDERPADS AND DIAPERS) ×2 IMPLANT
YANKAUER SUCT BULB TIP NO VENT (SUCTIONS) IMPLANT

## 2012-08-28 NOTE — Op Note (Signed)
NAME:  Christina [name], STUMPP NO.:  [id number]  MEDICAL RECORD NO.:  [id number]  LOCATION:                                 FACILITY:  PHYSICIAN:  Toni Arthurs, MD             DATE OF BIRTH:  DATE OF PROCEDURE:  08/28/2012 DATE OF DISCHARGE:                              OPERATIVE REPORT   PREOPERATIVE DIAGNOSIS:  Left foot dorsal exostosis at the first metatarsal and medial cuneiform.  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  PROCEDURE: 1. Exostectomy left dorsal first metatarsal. 2. Exostectomy left dorsal medial cuneiform. 3. Lateral and oblique radiographs of the left foot.  SURGEON:  Toni Arthurs, MD.  ANESTHESIA:  General.  ESTIMATED BLOOD LOSS:  Minimal.  TOURNIQUET TIME:  15 minutes with an ankle Esmarch.  COMPLICATIONS:  None apparent.  DISPOSITION:  Extubated, awake, and stable to recovery.  INDICATIONS FOR PROCEDURE:  The patient is a 78 year old woman with a past medical history significant for post-polio syndrome.  She has a painful exostosis at the dorsum of the foot that is creating difficulty with shoe wear.  She has failed treatment with activity modification, oral anti-inflammatories, and shoe wear modifications.  She presents now for excision of these painful bony prominences.  She understands the risks and benefits, the alternative treatment options and elects to surgical treatment.  She specifically understands the risks of bleeding, infection, nerve damage, blood clots, need for additional surgery, amputation, and death.  PROCEDURE IN DETAIL:  After preoperative consent was obtained, the correct operative site was identified.  The patient was brought to the operating room and placed supine on the operating table.  General anesthesia was induced.  Preoperative antibiotics were administered. Surgical time-out was taken.  Left lower extremity was prepped and draped in standard  sterile fashion.  The foot was exsanguinated and an Esmarch tourniquet was wrapped around the ankle.  A longitudinal incision was made over the first tarsometatarsal joint.  Sharp dissection was carried down through the skin and subcutaneous tissue. The periosteum was incised over the palpable exostosis.  The periosteum was elevated medially and laterally exposing exostosis at the base of the first metatarsal dorsally and also at the distal aspect of the medial cuneiform dorsally.  The rongeur was then used to remove the exostosis at the first metatarsal as well as a small loose body.  The rongeur was then used to remove the exostosis at the medial cuneiform dorsally.  Lateral and oblique radiographs of the foot showed excision of the exostosis.  The wound was irrigated copiously.  The periosteum was repaired with simple sutures of 2-0 Vicryl.  A running 4-0 Monocryl subcuticular suture was used to close the skin incision.  Steri-Strips were applied, followed by sterile dressings and a compression wrap. Tourniquet was released at 15 minutes.  The patient was then awakened from anesthesia and transported to the recovery room in stable condition.  FOLLOWUP PLAN:  The patient will be weightbearing as tolerated on her left foot and a hard-sole shoe.  She will follow up with me in 2 weeks for a wound check.  Toni Arthurs, MD     JH/MEDQ  D:  08/28/2012  T:  08/28/2012  Job:  [id number]

## 2012-08-28 NOTE — Anesthesia Preprocedure Evaluation (Signed)
Anesthesia Evaluation  Patient identified by MRN, date of birth, ID band Patient awake    Reviewed: Allergy & Precautions, H&P , NPO status , Patient's Chart, lab work & pertinent test results  Airway Mallampati: II TM Distance: >3 FB Neck ROM: Full    Dental  (+) Teeth Intact and Dental Advisory Given   Pulmonary  breath sounds clear to auscultation        Cardiovascular hypertension, Pt. on medications Rhythm:Regular Rate:Normal     Neuro/Psych    GI/Hepatic   Endo/Other    Renal/GU      Musculoskeletal   Abdominal   Peds  Hematology   Anesthesia Other Findings   Reproductive/Obstetrics                           Anesthesia Physical Anesthesia Plan  ASA: III  Anesthesia Plan: General   Post-op Pain Management:    Induction: Intravenous  Airway Management Planned: LMA  Additional Equipment:   Intra-op Plan:   Post-operative Plan: Extubation in OR  Informed Consent: I have reviewed the patients History and Physical, chart, labs and discussed the procedure including the risks, benefits and alternatives for the proposed anesthesia with the patient or authorized representative who has indicated his/her understanding and acceptance.   Dental advisory given  Plan Discussed with: CRNA, Anesthesiologist and Surgeon  Anesthesia Plan Comments:         Anesthesia Quick Evaluation

## 2012-08-28 NOTE — H&P (Signed)
Christina [name] is an 78 y.o. female.   Chief Complaint: left dorsal foot mass HPI: 78 y./o female with PMH of polio presents with a painful left dorsal foot mass.  She presents for excision of what appears to be an exostosis at the 1st TMT joint.  Past Medical History  Diagnosis Date  . ALLERGIC RHINITIS 12/31/2007  . ANXIETY 11/30/2006  . BAKER'S CYST, RIGHT KNEE 11/30/2006  . BURSITIS, LEFT HIP 12/31/2007  . Cardiomegaly 05/08/2010  . DEGENERATIVE JOINT DISEASE 11/30/2006  . GASTROENTERITIS WITHOUT DEHYDRATION 06/06/2007  . GERD 11/30/2006  . Headache 10/22/2008  . HYPERCHOLESTEROLEMIA 11/30/2006  . HYPERLIPIDEMIA 04/16/2007  . HYPERTENSION 11/30/2006  . OSTEOPOROSIS 10/22/2008  . PERIPHERAL VASCULAR DISEASE 04/16/2007  . 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 VITAMIN D DEFICIENCY 05/08/2010  . Anxiety   . Allergic rhinitis   . Hiatal hernia     with Schatzki's ring  . Diverticulosis   . DJD (degenerative joint disease) of knee     bilateral  . Hemorrhoids   . Carotid artery occlusion 2010  . Complication of anesthesia     very easily sedated-due to hx polio    Past Surgical History  Procedure Laterality Date  . S/p right first finger t rigger finger    . S/p right cea feb 2010      Dr Hart Rochester  . Breast biopsy  1992    right  . Tonsillectomy and adenoidectomy  age 79  . Left ankle surgery  1949    x 2  . Left great toe fusion  1956  . Endometrial biopsy  1994  . Colonoscopy  2003; 11/2310    2003:External hemorrhoids 2012: diverticulosis, external hemorrhoids  . Carotid endarterectomy  2010  . Post polio Left 1949, 1956    Family History  Problem Relation Age of Onset  . Stroke Father     mother, MGM  . Hypertension Father     mother, MGM  . Deep vein thrombosis Father   . Heart disease Father   . Osteoporosis Mother   . Anemia Mother   . Hypertension Mother   . Stroke Mother   . Leukemia Paternal Grandmother     MGF  . Heart attack      uncle  . Cirrhosis      uncle   . Throat cancer      uncle  . Breast cancer Maternal Aunt   . Arthritis Maternal Grandmother   . Colon cancer Neg Hx    Social History:  reports that she has never smoked. She has never used smokeless tobacco. She reports that  drinks alcohol. She reports that she does not use illicit drugs.  Allergies:  Allergies  Allergen Reactions  . Hydromorphone Hcl Anaphylaxis  . Latex Rash  . Sulfonamide Derivatives Rash    Medications Prior to Admission  Medication Sig Dispense Refill  . aspirin 81 MG EC tablet Take 81 mg by mouth daily.        Marland Kitchen esomeprazole (NEXIUM) 40 MG capsule Take 1 capsule (40 mg total) by mouth daily. As needed  90 capsule  3  . estradiol (ESTRACE VAGINAL) 0.1 MG/GM vaginal cream As directed      . fexofenadine (ALLEGRA) 180 MG tablet Take 1 tablet (180 mg total) by mouth daily.  30 tablet  3  . fluticasone (FLONASE) 50 MCG/ACT nasal spray Place 2 sprays into the nose daily.  16 g  4  . nystatin (MYCOSTATIN) 100000 UNIT/ML suspension Take 5  mLs (500,000 Units total) by mouth 4 (four) times daily.  60 mL  0  . rosuvastatin (CRESTOR) 20 MG tablet Take 1 tablet (20 mg total) by mouth daily.  90 tablet  3  . valsartan (DIOVAN) 320 MG tablet Take 1 tablet (320 mg total) by mouth daily. Take 1/2 if blood pressure is under 140  90 tablet  3  . Calcium Carbonate-Vit D-Min (CALCIUM 1200 PO) Take by mouth daily.        . Cholecalciferol (VITAMIN D) 2000 UNITS CAPS Take by mouth daily.        . Testosterone Propionate (FIRST-TESTOSTERONE MC) 2 % CREA As needed        No results found for this or any previous visit (from the past 48 hour(s)). No results found.  ROS  No recent f/c/n/v.  Blood pressure 142/82, pulse 74, temperature 97.7 F (36.5 C), temperature source Oral, resp. rate 18, height 5\' 1"  (1.549 m), weight 79.89 kg (176 lb 2 oz), SpO2 95.00%. Physical Exam  wn wd woman in nad.  A and O.  Mood and affect normal.  EOMI.  Resp unlabored.  L foot with firm mass at  dorsal 1st TMT joint.  1+ dp pulses.  Feels LT at tthe dorsal foot.  Assessment/Plan Left dorsal foot mass - to OR for excision.  The risks and benefits of the alternative treatment options have been discussed in detail.  The patient wishes to proceed with surgery and specifically understands risks of bleeding, infection, nerve damage, blood clots, need for additional surgery, amputation and death.   Toni Arthurs 09-17-12, 7:23 AM

## 2012-08-28 NOTE — Anesthesia Procedure Notes (Signed)
Procedure Name: LMA Insertion Date/Time: 08/28/2012 7:35 AM Performed by: Caren Macadam Pre-anesthesia Checklist: Patient identified, Emergency Drugs available, Suction available and Patient being monitored Patient Re-evaluated:Patient Re-evaluated prior to inductionOxygen Delivery Method: Circle System Utilized Preoxygenation: Pre-oxygenation with 100% oxygen Intubation Type: IV induction Ventilation: Mask ventilation without difficulty LMA: LMA inserted LMA Size: 4.0 Number of attempts: 1 Airway Equipment and Method: bite block Placement Confirmation: positive ETCO2 and breath sounds checked- equal and bilateral Tube secured with: Tape Dental Injury: Teeth and Oropharynx as per pre-operative assessment

## 2012-08-28 NOTE — Anesthesia Postprocedure Evaluation (Signed)
  Anesthesia Post-op Note  Patient: Christina [name]  Procedure(s) Performed: Procedure(s): LEFT FOOT DORSAL EXOSTECTOMY (Left)  Patient Location: PACU  Anesthesia Type:General  Level of Consciousness: awake, alert  and oriented  Airway and Oxygen Therapy: Patient Spontanous Breathing and Patient connected to face mask oxygen  Post-op Pain: mild  Post-op Assessment: Post-op Vital signs reviewed  Post-op Vital Signs: Reviewed  Complications: No apparent anesthesia complications

## 2012-08-28 NOTE — Transfer of Care (Signed)
Immediate Anesthesia Transfer of Care Note  Patient: Christina [name]  Procedure(s) Performed: Procedure(s): LEFT FOOT DORSAL EXOSTECTOMY (Left)  Patient Location: PACU  Anesthesia Type:General  Level of Consciousness: awake and alert   Airway & Oxygen Therapy: Patient Spontanous Breathing and Patient connected to face mask oxygen  Post-op Assessment: Report given to PACU RN and Post -op Vital signs reviewed and stable  Post vital signs: Reviewed and stable  Complications: No apparent anesthesia complications

## 2012-08-28 NOTE — Brief Op Note (Signed)
08/28/2012  8:04 AM  PATIENT:  Christina [name]  78 y.o. female  PRE-OPERATIVE DIAGNOSIS:  LEFT FOOT DORSAL EXOTOSeS from the 1st MT and medial cuneiform  POST-OPERATIVE DIAGNOSIS: same  Procedure(s): 1.  Exostectomy left dorsal 1st MT 2.  Exostectomy left dorsal medial cuneiform 3.  Lateral and oblique radiographs of the left foot  SURGEON:  Toni Arthurs, MD  ASSISTANT: n/a  ANESTHESIA:   General  EBL:  minimal   TOURNIQUET:  15 min with an ankle esmarch  COMPLICATIONS:  None apparent  DISPOSITION:  Extubated, awake and stable to recovery.  DICTATION ID:  [id number]

## 2012-08-29 LAB — HM MAMMOGRAPHY

## 2012-10-09 ENCOUNTER — Telehealth: Payer: Self-pay | Admitting: Internal Medicine

## 2012-10-09 NOTE — Telephone Encounter (Signed)
Patient informed of MD instructions. 

## 2012-10-09 NOTE — Telephone Encounter (Signed)
Due to the lateness, would recommend pt go to Urgent Care

## 2012-10-09 NOTE — Telephone Encounter (Signed)
Patient Information:  Caller Name: Christina [name]  Phone: [phone]  Patient: Christina [name], Christina [name]  Gender: Female  DOB: November 28, 1934  Age: 78 Years  PCP: Oliver Barre (Adults only)  Office Follow Up:  Does the office need to follow up with this patient?: Yes  Instructions For The Office: PLS CALL PT BACK FOR APPT NEEDS  RN Note:  Pt noticed red, rawness area at groin 1 week ago, redness is spreading down thigh and becoming itchy. No same day appts.  PLEASE CALL PT BACK FOR SAME DAY APPT.   Symptoms  Reason For Call & Symptoms: Raw, reddness at Groin  Reviewed Health History In EMR: Yes  Reviewed Medications In EMR: Yes  Reviewed Allergies In EMR: Yes  Reviewed Surgeries / Procedures: Yes  Date of Onset of Symptoms: 10/02/2012  Treatments Tried: hydrogen proxide, neosporin  Treatments Tried Worked: No  Guideline(s) Used:  Rash or Redness - Localized  Disposition Per Guideline:   Go to Office Now  Reason For Disposition Reached:   Looks like a boil, infected sore, deep ulcer, or other infected rash (spreading redness, pus)  Advice Given:  N/A  Patient Will Follow Care Advice:  YES

## 2013-01-01 ENCOUNTER — Ambulatory Visit (INDEPENDENT_AMBULATORY_CARE_PROVIDER_SITE_OTHER): Payer: Medicare Other

## 2013-01-01 DIAGNOSIS — Z23 Encounter for immunization: Secondary | ICD-10-CM

## 2013-01-23 ENCOUNTER — Ambulatory Visit (INDEPENDENT_AMBULATORY_CARE_PROVIDER_SITE_OTHER): Payer: Medicare Other | Admitting: Internal Medicine

## 2013-01-23 ENCOUNTER — Telehealth: Payer: Self-pay | Admitting: Internal Medicine

## 2013-01-23 ENCOUNTER — Encounter: Payer: Self-pay | Admitting: Internal Medicine

## 2013-01-23 ENCOUNTER — Other Ambulatory Visit (INDEPENDENT_AMBULATORY_CARE_PROVIDER_SITE_OTHER): Payer: Medicare Other

## 2013-01-23 VITALS — BP 150/100 | HR 73 | Temp 98.6°F | Ht 61.5 in | Wt 181.5 lb

## 2013-01-23 DIAGNOSIS — R350 Frequency of micturition: Secondary | ICD-10-CM

## 2013-01-23 DIAGNOSIS — I10 Essential (primary) hypertension: Secondary | ICD-10-CM

## 2013-01-23 DIAGNOSIS — F411 Generalized anxiety disorder: Secondary | ICD-10-CM

## 2013-01-23 DIAGNOSIS — R300 Dysuria: Secondary | ICD-10-CM | POA: Insufficient documentation

## 2013-01-23 LAB — URINALYSIS, ROUTINE W REFLEX MICROSCOPIC
Bilirubin Urine: NEGATIVE
Ketones, ur: NEGATIVE
Nitrite: NEGATIVE
Specific Gravity, Urine: 1.01
Total Protein, Urine: NEGATIVE
Urine Glucose: NEGATIVE
Urobilinogen, UA: 0.2
pH: 6 (ref 5.0–8.0)

## 2013-01-23 LAB — POCT URINALYSIS DIPSTICK
Bilirubin, UA: NEGATIVE
Blood, UA: NEGATIVE
Glucose, UA: NEGATIVE
Ketones, UA: NEGATIVE
Nitrite, UA: NEGATIVE
Protein, UA: NEGATIVE
Spec Grav, UA: 1.01
Urobilinogen, UA: NEGATIVE
pH, UA: 5

## 2013-01-23 MED ORDER — CIPROFLOXACIN HCL 500 MG PO TABS: 500.0000 mg | ORAL_TABLET | Freq: Two times a day (BID) | ORAL | Status: DC

## 2013-01-23 NOTE — Patient Instructions (Signed)
Please take all new medication as prescribed Please continue all other medications as before Please have the pharmacy call with any other refills you may need.  Your Specimen was sent to the pharmacy for culture, with the results expected in 2-3 days  Please remember to sign up for My Chart if you have not done so, as this will be important to you in the future with finding out test results, communicating by private email, and scheduling acute appointments online when needed.

## 2013-01-23 NOTE — Assessment & Plan Note (Signed)
prob uti - Mild to mod, for antibx course,  to f/u any worsening symptoms or concerns, for urine studies as well

## 2013-01-23 NOTE — Telephone Encounter (Signed)
Pt has an appt with Dr. Jonny Ruiz today 01/23/13.

## 2013-01-23 NOTE — Telephone Encounter (Signed)
Called pt, left vm to return call so we can go ahead and schedule with Dr. Jonny Ruiz today or Sat clinic tomorrow.

## 2013-01-23 NOTE — Assessment & Plan Note (Signed)
stable overall by history and exam, recent data reviewed with pt, and pt to continue medical treatment as before,  to f/u any worsening symptoms or concerns BP Readings from Last 3 Encounters:  01/23/13 150/100  08/28/12 125/86  08/28/12 125/86

## 2013-01-23 NOTE — Assessment & Plan Note (Signed)
stable overall by history and exam, recent data reviewed with pt, and pt to continue medical treatment as before,  to f/u any worsening symptoms or concerns Lab Results  Component Value Date   WBC 7.0 06/02/2012   HGB 13.2 08/28/2012   HCT 37.5 06/02/2012   PLT 194.0 06/02/2012   GLUCOSE 90 08/25/2012   CHOL 155 06/02/2012   TRIG 79.0 06/02/2012   HDL 68.50 06/02/2012   LDLCALC 71 06/02/2012   ALT 19 06/02/2012   AST 16 06/02/2012   NA 141 08/25/2012   K 4.7 08/25/2012   CL 104 08/25/2012   CREATININE 0.61 08/25/2012   BUN 14 08/25/2012   CO2 26 08/25/2012   TSH 2.71 06/02/2012   INR 0.9 05/24/2008

## 2013-01-23 NOTE — Telephone Encounter (Signed)
Patient Information:  Caller Name: Mercie  Phone: [phone]  Patient: Christina, [name]  Gender: Female  DOB: Jun 24, 1934  Age: 78 Years  PCP: Oliver Barre (Adults only)  Office Follow Up:  Does the office need to follow up with this patient?: Yes  Instructions For The Office: No appts. available in Epic Electronic Health Record. Disposition obtained of " Go to office now."  Please return call to patient at [phone] regarding work in appt.  Patient states she has another appt. scheduled regarding her cataract at 1515 01/23/13.  RN Note:  Patient states she developed urinary frequency, urgency, burning with urination, right flank discomfort, vaginal irritation and redness. Onset 01/20/13. Patient describes flank discomfort as "aching" at a "2" on 1-10 scale. Denies hematuria. Care advice given pe guidelines. Patient advised increased fluids. Call back parameters reviewed. Patient verbalizes understanding. No appts. available in Epic Electronic Health Record. Disposition obtained of " Go to office now."  Please return call to patient at [phone] regarding work in appt.  Patient states she has another appt. scheduled regarding her cataract at 1515 01/23/13.   Symptoms  Reason For Call & Symptoms: Urinary urgency/frequency, burning with urination, right flank pain, vaginal irritation and redness.  Reviewed Health History In EMR: Yes  Reviewed Medications In EMR: Yes  Reviewed Allergies In EMR: Yes  Reviewed Surgeries / Procedures: Yes  Date of Onset of Symptoms: 01/20/2013  Guideline(s) Used:  Flank Pain  Disposition Per Guideline:   Go to Office Now  Reason For Disposition Reached:   Pain or burning with urination  Advice Given:  Call Back If:  You become worse.  Patient Will Follow Care Advice:  YES

## 2013-01-23 NOTE — Telephone Encounter (Signed)
Ok to see today.  

## 2013-01-23 NOTE — Progress Notes (Signed)
Subjective:    Patient ID: Christina [name], female    DOB: 1934/05/06, 78 y.o.   MRN: [id number]  HPIHere with acute onset GU symptoms of dysuria, frequency x 3 days, without urgency, flank pain, hematuria or n/v, fever, chills. Has had some lower back pain. Pt denies new neurological symptoms such as new headache, or facial or extremity weakness or numbness  Pt denies chest pain, increased sob or doe, wheezing, orthopnea, PND, increased LE swelling, palpitations, dizziness or syncope.   Pt denies polydipsia, polyuria.  Denies worsening depressive symptoms, suicidal ideation, or panic Past Medical History  Diagnosis Date  . ALLERGIC RHINITIS 12/31/2007  . ANXIETY 11/30/2006  . BAKER'S CYST, RIGHT KNEE 11/30/2006  . BURSITIS, LEFT HIP 12/31/2007  . Cardiomegaly 05/08/2010  . DEGENERATIVE JOINT DISEASE 11/30/2006  . GASTROENTERITIS WITHOUT DEHYDRATION 06/06/2007  . GERD 11/30/2006  . Headache(784.0) 10/22/2008  . HYPERCHOLESTEROLEMIA 11/30/2006  . HYPERLIPIDEMIA 04/16/2007  . HYPERTENSION 11/30/2006  . OSTEOPOROSIS 10/22/2008  . PERIPHERAL VASCULAR DISEASE 04/16/2007  . 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 VITAMIN D DEFICIENCY 05/08/2010  . Anxiety   . Allergic rhinitis   . Hiatal hernia     with Schatzki's ring  . Diverticulosis   . DJD (degenerative joint disease) of knee     bilateral  . Hemorrhoids   . Carotid artery occlusion 2010  . Complication of anesthesia     very easily sedated-due to hx polio   Past Surgical History  Procedure Laterality Date  . S/p right first finger t rigger finger    . S/p right cea feb 2010      Dr Hart [place]  . Breast biopsy  1992    right  . Tonsillectomy and adenoidectomy  age 87  . Left ankle surgery  1949    x 2  . Left great toe fusion  1956  . Endometrial biopsy  1994  . Colonoscopy  2003; 11/2310    2003:External hemorrhoids 2012: diverticulosis, external hemorrhoids  . Carotid endarterectomy  2010  . Post polio Left 1949, 1956  . Calcaneal osteotomy  Left 08/28/2012    Procedure: LEFT FOOT DORSAL EXOSTECTOMY;  Surgeon: Toni Arthurs, MD;  Location: [facility];  Service: Orthopedics;  Laterality: Left;    reports that she has never smoked. She has never used smokeless tobacco. She reports that she drinks alcohol. She reports that she does not use illicit drugs. family history includes Anemia in her mother; Arthritis in her maternal grandmother; Breast cancer in her maternal aunt; Cirrhosis in an other family member; Deep vein thrombosis in her father; Heart attack in an other family member; Heart disease in her father; Hypertension in her father and mother; Leukemia in her paternal grandmother; Osteoporosis in her mother; Stroke in her father and mother; Throat cancer in an other family member. There is no history of Colon cancer. Allergies  Allergen Reactions  . Hydromorphone Hcl Anaphylaxis  . Latex Rash  . Sulfonamide Derivatives Rash   Current Outpatient Prescriptions on File Prior to Visit  Medication Sig Dispense Refill  . aspirin 81 MG EC tablet Take 81 mg by mouth daily.        . Calcium Carbonate-Vit D-Min (CALCIUM 1200 PO) Take by mouth daily.        . Cholecalciferol (VITAMIN D) 2000 UNITS CAPS Take by mouth daily.        Marland Kitchen esomeprazole (NEXIUM) 40 MG capsule Take 1 capsule (40 mg total) by mouth daily. As needed  90 capsule  3  . estradiol (ESTRACE VAGINAL) 0.1 MG/GM vaginal cream As directed      . fexofenadine (ALLEGRA) 180 MG tablet Take 1 tablet (180 mg total) by mouth daily.  30 tablet  3  . fluticasone (FLONASE) 50 MCG/ACT nasal spray Place 2 sprays into the nose daily.  16 g  4  . HYDROcodone-acetaminophen (NORCO) 5-325 MG per tablet Take 1 tablet by mouth every 6 (six) hours as needed for pain.  30 tablet  0  . nystatin (MYCOSTATIN) 100000 UNIT/ML suspension Take 5 mLs (500,000 Units total) by mouth 4 (four) times daily.  60 mL  0  . rosuvastatin (CRESTOR) 20 MG tablet Take 1 tablet (20 mg total) by mouth  daily.  90 tablet  3  . Testosterone Propionate (FIRST-TESTOSTERONE MC) 2 % CREA As needed      . valsartan (DIOVAN) 320 MG tablet Take 1 tablet (320 mg total) by mouth daily. Take 1/2 if blood pressure is under 140  90 tablet  3   No current facility-administered medications on file prior to visit.   Review of Systems  Constitutional: Negative for unexpected weight change, or unusual diaphoresis  HENT: Negative for tinnitus.   Eyes: Negative for photophobia and visual disturbance.  Respiratory: Negative for choking and stridor.   Gastrointestinal: Negative for vomiting and blood in stool.  Genitourinary: Negative for hematuria and decreased urine volume.  Musculoskeletal: Negative for acute joint swelling Skin: Negative for color change and wound.  Neurological: Negative for tremors and numbness other than noted  Psychiatric/Behavioral: Negative for decreased concentration or  hyperactivity.       Objective:   Physical Exam BP 150/100  Pulse 73  Temp(Src) 98.6 F (37 C) (Oral)  Ht 5' 1.5" (1.562 m)  Wt 181 lb 8 oz (82.328 kg)  BMI 33.74 kg/m2  SpO2 95% VS noted, mild ill appearing Constitutional: Pt appears well-developed and well-nourished.  HENT: Head: NCAT.  Right Ear: External ear normal.  Left Ear: External ear normal.  Eyes: Conjunctivae and EOM are normal. Pupils are equal, round, and reactive to light.  Neck: Normal range of motion. Neck supple.  Cardiovascular: Normal rate and regular rhythm.   Pulmonary/Chest: Effort normal and breath sounds normal.  Abd:  Soft, non-distended, + BS, with low mid abd tender without guarding, no flank tender Neurological: Pt is alert. Not confused  Skin: Skin is warm. No erythema.  Psychiatric: Pt behavior is normal. Thought content normal. not nervous appearing or depressed affect    Assessment & Plan:

## 2013-01-23 NOTE — Telephone Encounter (Signed)
Pt call back to check up on the appt request. Pt stated that she has an appt at 3:15 pm today so we need to use this phone # [phone] when we call. Offer sat clinic appt but pt want to wait to see what Dr. Jonny Ruiz want to do. Please advise.

## 2013-01-24 LAB — URINE CULTURE: Organism ID, Bacteria: NO GROWTH

## 2013-01-26 ENCOUNTER — Telehealth: Payer: Self-pay | Admitting: Internal Medicine

## 2013-01-26 NOTE — Telephone Encounter (Signed)
The patient called the triage line and is hoping to get her UA culture results.  Her callback is [phone]

## 2013-01-26 NOTE — Telephone Encounter (Signed)
The patient called the triage line and is hoping to get the results of her UA culture. She stated Dr.John told her to call back on Monday to get results.  Her callback - [phone]   Thanks!

## 2013-01-27 MED ORDER — FLUCONAZOLE 150 MG PO TABS: ORAL_TABLET | ORAL | Status: DC

## 2013-01-27 NOTE — Telephone Encounter (Signed)
Patient informed. 

## 2013-01-27 NOTE — Telephone Encounter (Signed)
Patient notified of results.

## 2013-01-27 NOTE — Telephone Encounter (Signed)
Done erx 

## 2013-01-27 NOTE — Telephone Encounter (Signed)
Patient informed of lab results.  Please send something in for yeast [facility].

## 2013-02-26 ENCOUNTER — Telehealth: Payer: Self-pay | Admitting: Internal Medicine

## 2013-02-26 MED ORDER — ROSUVASTATIN CALCIUM 20 MG PO TABS: 20.0000 mg | ORAL_TABLET | Freq: Every day | ORAL | Status: DC

## 2013-02-26 NOTE — Telephone Encounter (Signed)
Pt wants a new RX for Crestor 20mg  sent to [facility].  She is changing from mail-order so she can just get it a month at a time for the rest of year.

## 2013-02-26 NOTE — Telephone Encounter (Signed)
Refill done as requested 

## 2013-05-08 ENCOUNTER — Other Ambulatory Visit: Payer: Self-pay | Admitting: Vascular Surgery

## 2013-05-08 DIAGNOSIS — I6529 Occlusion and stenosis of unspecified carotid artery: Secondary | ICD-10-CM

## 2013-05-08 DIAGNOSIS — Z48812 Encounter for surgical aftercare following surgery on the circulatory system: Secondary | ICD-10-CM

## 2013-05-23 ENCOUNTER — Encounter (HOSPITAL_COMMUNITY): Payer: Self-pay | Admitting: Emergency Medicine

## 2013-05-23 ENCOUNTER — Emergency Department (HOSPITAL_COMMUNITY)
Admission: EM | Admit: 2013-05-23 | Discharge: 2013-05-23 | Disposition: A | Discharge status: Home / Self Care | Payer: Medicare HMO | Attending: Emergency Medicine | Admitting: Emergency Medicine

## 2013-05-23 DIAGNOSIS — Z91040 Latex allergy status: Secondary | ICD-10-CM | POA: Insufficient documentation

## 2013-05-23 DIAGNOSIS — Z8659 Personal history of other mental and behavioral disorders: Secondary | ICD-10-CM | POA: Insufficient documentation

## 2013-05-23 DIAGNOSIS — Z79899 Other long term (current) drug therapy: Secondary | ICD-10-CM | POA: Insufficient documentation

## 2013-05-23 DIAGNOSIS — R1013 Epigastric pain: Secondary | ICD-10-CM

## 2013-05-23 DIAGNOSIS — M25519 Pain in unspecified shoulder: Secondary | ICD-10-CM | POA: Insufficient documentation

## 2013-05-23 DIAGNOSIS — IMO0002 Reserved for concepts with insufficient information to code with codable children: Secondary | ICD-10-CM | POA: Insufficient documentation

## 2013-05-23 DIAGNOSIS — R112 Nausea with vomiting, unspecified: Secondary | ICD-10-CM

## 2013-05-23 DIAGNOSIS — R6883 Chills (without fever): Secondary | ICD-10-CM | POA: Insufficient documentation

## 2013-05-23 DIAGNOSIS — K219 Gastro-esophageal reflux disease without esophagitis: Secondary | ICD-10-CM | POA: Insufficient documentation

## 2013-05-23 DIAGNOSIS — Z8619 Personal history of other infectious and parasitic diseases: Secondary | ICD-10-CM | POA: Insufficient documentation

## 2013-05-23 DIAGNOSIS — M1710 Unilateral primary osteoarthritis, unspecified knee: Secondary | ICD-10-CM | POA: Insufficient documentation

## 2013-05-23 DIAGNOSIS — I10 Essential (primary) hypertension: Secondary | ICD-10-CM | POA: Insufficient documentation

## 2013-05-23 DIAGNOSIS — Z7982 Long term (current) use of aspirin: Secondary | ICD-10-CM | POA: Insufficient documentation

## 2013-05-23 DIAGNOSIS — E7800 Pure hypercholesterolemia, unspecified: Secondary | ICD-10-CM | POA: Insufficient documentation

## 2013-05-23 DIAGNOSIS — E785 Hyperlipidemia, unspecified: Secondary | ICD-10-CM | POA: Insufficient documentation

## 2013-05-23 LAB — COMPREHENSIVE METABOLIC PANEL
AST: 15 U/L (ref 0–37)
Albumin: 4.1 g/dL (ref 3.5–5.2)
BUN: 23 mg/dL (ref 6–23)
CO2: 26 mEq/L (ref 19–32)
Calcium: 9.3 mg/dL (ref 8.4–10.5)
Creatinine, Ser: 0.71 mg/dL (ref 0.50–1.10)
GFR calc Af Amer: >90 mL/min (ref >90)
GFR calc non Af Amer: 82 mL/min — ABNORMAL LOW (ref >90)
Total Bilirubin: 0.5 mg/dL (ref 0.3–1.2)
Total Protein: 7.1 g/dL (ref 6.0–8.3)

## 2013-05-23 LAB — CBC WITH DIFFERENTIAL/PLATELET
Basophils Absolute: 0 10*3/uL (ref 0.0–0.1)
Basophils Relative: 0 % (ref 0–1)
Eosinophils Absolute: 0 K/uL (ref 0.0–0.7)
Eosinophils Relative: 0 % (ref 0–5)
HCT: 37.5 % (ref 36.0–46.0)
Hemoglobin: 12.3 g/dL (ref 12.0–15.0)
Lymphocytes Relative: 3 % — ABNORMAL LOW (ref 12–46)
Lymphs Abs: 0.2 K/uL — ABNORMAL LOW (ref 0.7–4.0)
MCH: 30.6 pg (ref 26.0–34.0)
MCHC: 32.8 g/dL (ref 30.0–36.0)
MCV: 93.3 fL (ref 78.0–100.0)
Monocytes Absolute: 0.2 10*3/uL (ref 0.1–1.0)
Monocytes Relative: 3 % (ref 3–12)
Neutro Abs: 6.5 10*3/uL (ref 1.7–7.7)
Neutrophils Relative %: 94 % — ABNORMAL HIGH (ref 43–77)
Platelets: 180 10*3/uL (ref 150–400)
RBC: 4.02 MIL/uL (ref 3.87–5.11)
RDW: 14.1 % (ref 11.5–15.5)
WBC: 6.9 K/uL (ref 4.0–10.5)

## 2013-05-23 LAB — COMPREHENSIVE METABOLIC PANEL WITH GFR
ALT: 11 U/L (ref 0–35)
Alkaline Phosphatase: 48 U/L (ref 39–117)
Chloride: 102 meq/L (ref 96–112)
Glucose, Bld: 132 mg/dL — ABNORMAL HIGH (ref 70–99)
Potassium: 4.3 meq/L (ref 3.7–5.3)
Sodium: 141 meq/L (ref 137–147)

## 2013-05-23 LAB — TROPONIN I: Troponin I: <0.3 ng/mL (ref <0.30)

## 2013-05-23 LAB — LIPASE, BLOOD: Lipase: 19 U/L (ref 11–59)

## 2013-05-23 MED ORDER — ONDANSETRON 8 MG PO TBDP: 8.0000 mg | ORAL_TABLET | Freq: Two times a day (BID) | ORAL | Status: DC | PRN

## 2013-05-23 MED ORDER — SODIUM CHLORIDE 0.9 % IV BOLUS (SEPSIS)
500.0000 mL | Freq: Once | INTRAVENOUS | Status: AC
  Administered 2013-05-23: 500 mL via INTRAVENOUS

## 2013-05-23 MED ORDER — ONDANSETRON HCL 4 MG/2ML IJ SOLN
4.0000 mg | Freq: Once | INTRAMUSCULAR | Status: AC
  Administered 2013-05-23: 4 mg via INTRAVENOUS
  Filled 2013-05-23: qty 2

## 2013-05-23 NOTE — ED Notes (Signed)
Pt tolerated ice water by mouth w/o difficulty

## 2013-05-23 NOTE — Discharge Instructions (Signed)
Abdominal Pain, Adult °Many things can cause abdominal pain. Usually, abdominal pain is not caused by a disease and will improve without treatment. It can often be observed and treated at home. Your health care provider will do a physical exam and possibly order blood tests and X-rays to help determine the seriousness of your pain. However, in many cases, more time must pass before a clear cause of the pain can be found. Before that point, your health care provider may not know if you need more testing or further treatment. °HOME CARE INSTRUCTIONS  °Monitor your abdominal pain for any changes. The following actions may help to alleviate any discomfort you are experiencing: °· Only take over-the-counter or prescription medicines as directed by your health care provider. °· Do not take laxatives unless directed to do so by your health care provider. °· Try a clear liquid diet (broth, tea, or water) as directed by your health care provider. Slowly move to a bland diet as tolerated. °SEEK MEDICAL CARE IF: °· You have unexplained abdominal pain. °· You have abdominal pain associated with nausea or diarrhea. °· You have pain when you urinate or have a bowel movement. °· You experience abdominal pain that wakes you in the night. °· You have abdominal pain that is worsened or improved by eating food. °· You have abdominal pain that is worsened with eating fatty foods. °SEEK IMMEDIATE MEDICAL CARE IF:  °· Your pain does not go away within 2 hours. °· You have a fever. °· You keep throwing up (vomiting). °· Your pain is felt only in portions of the abdomen, such as the right side or the left lower portion of the abdomen. °· You pass bloody or black tarry stools. °MAKE SURE YOU: °· Understand these instructions.   °· Will watch your condition.   °· Will get help right away if you are not doing well or get worse.   °Document Released: 01/03/2005 Document Revised: 01/14/2013 Document Reviewed: 12/03/2012 °ExitCare® Patient  Information ©2014 [employer]. ° °Nausea and Vomiting °Nausea is a sick feeling that often comes before throwing up (vomiting). Vomiting is a reflex where stomach contents come out of your mouth. Vomiting can cause severe loss of body fluids (dehydration). Children and elderly adults can become dehydrated quickly, especially if they also have diarrhea. Nausea and vomiting are symptoms of a condition or disease. It is important to find the cause of your symptoms. °CAUSES  °· Direct irritation of the stomach lining. This irritation can result from increased acid production (gastroesophageal reflux disease), infection, food poisoning, taking certain medicines (such as nonsteroidal anti-inflammatory drugs), alcohol use, or tobacco use. °· Signals from the brain. These signals could be caused by a headache, heat exposure, an inner ear disturbance, increased pressure in the brain from injury, infection, a tumor, or a concussion, pain, emotional stimulus, or metabolic problems. °· An obstruction in the gastrointestinal tract (bowel obstruction). °· Illnesses such as diabetes, hepatitis, gallbladder problems, appendicitis, kidney problems, cancer, sepsis, atypical symptoms of a heart attack, or eating disorders. °· Medical treatments such as chemotherapy and radiation. °· Receiving medicine that makes you sleep (general anesthetic) during surgery. °DIAGNOSIS °Your caregiver may ask for tests to be done if the problems do not improve after a few days. Tests may also be done if symptoms are severe or if the reason for the nausea and vomiting is not clear. Tests may include: °· Urine tests. °· Blood tests. °· Stool tests. °· Cultures (to look for evidence of infection). °· X-rays or   other imaging studies. °Test results can help your caregiver make decisions about treatment or the need for additional tests. °TREATMENT °You need to stay well hydrated. Drink frequently but in small amounts. You may wish to drink water, sports  drinks, clear broth, or eat frozen ice pops or gelatin dessert to help stay hydrated. When you eat, eating slowly may help prevent nausea. There are also some antinausea medicines that may help prevent nausea. °HOME CARE INSTRUCTIONS  °· Take all medicine as directed by your caregiver. °· If you do not have an appetite, do not force yourself to eat. However, you must continue to drink fluids. °· If you have an appetite, eat a normal diet unless your caregiver tells you differently. °· Eat a variety of complex carbohydrates (rice, wheat, potatoes, bread), lean meats, yogurt, fruits, and vegetables. °· Avoid high-fat foods because they are more difficult to digest. °· Drink enough water and fluids to keep your urine clear or pale yellow. °· If you are dehydrated, ask your caregiver for specific rehydration instructions. Signs of dehydration may include: °· Severe thirst. °· Dry lips and mouth. °· Dizziness. °· Dark urine. °· Decreasing urine frequency and amount. °· Confusion. °· Rapid breathing or pulse. °SEEK IMMEDIATE MEDICAL CARE IF:  °· You have blood or brown flecks (like coffee grounds) in your vomit. °· You have black or bloody stools. °· You have a severe headache or stiff neck. °· You are confused. °· You have severe abdominal pain. °· You have chest pain or trouble breathing. °· You do not urinate at least once every 8 hours. °· You develop cold or clammy skin. °· You continue to vomit for longer than 24 to 48 hours. °· You have a fever. °MAKE SURE YOU:  °· Understand these instructions. °· Will watch your condition. °· Will get help right away if you are not doing well or get worse. °Document Released: 03/26/2005 Document Revised: 06/18/2011 Document Reviewed: 08/23/2010 °ExitCare® Patient Information ©2014 [employer]. ° °

## 2013-05-23 NOTE — ED Notes (Signed)
Pt from home, was advised by PCP to be evaluated today after pt had sudden onset emesis overnight with L arm pain. Pt denies CP, SOB, dizziness. Pt adds that she is having L arm weakness, but is normal since she has hx of polio. Pt is A&O and in NAD

## 2013-05-23 NOTE — ED Notes (Signed)
Pt states she had a massage yesterday that was harder than usual and has left her with aching..states a hx of polio that leaves her w/aching/stiffness anyway.  Around 2am today states she awoke w/vomiting x 3 different times w/LT arm pain and pain across mid abdomen.  She called her PMD this am and was told to come in.  Currently has "my normal achiness, a little nausea and dry mouth"

## 2013-05-23 NOTE — ED Provider Notes (Signed)
CSN: [id number]     Arrival date & time 05/23/13  1052 History   First MD Initiated Contact with Christina [name] 05/23/13 1122     Chief Complaint  Christina [name] presents with  . Emesis  . Arm Pain     (Consider location/radiation/quality/duration/timing/severity/associated sxs/prior Treatment) HPI Comments: Pt has h/o polio causing some muscle weakness esp in left arm. Christina [name] had transient pains shoot down left arm.  They called PMD office and on call RN discussed with pt and due to left arm pain and abd pain and vomiting, could be anginal equivalent, recommended to come to the ED.  She notes that a group of friends they see weekly, they all met together 10 days ago, but 3 days ago 5 of them couldn't come due to illness.  She doesn't know what symptoms they had.  Pt denies back pain.  No sweats, no SOB, dizziness.  Husband ate the same dinner last night and he feels well.    Christina [name] is a 79 y.o. female presenting with vomiting and arm pain. The history is provided by the Christina [name] and the spouse.  Emesis Severity:  Moderate Duration:  10 hours Timing:  Intermittent Quality:  Stomach contents and undigested food Progression:  Partially resolved Chronicity:  New Context: not post-tussive and not self-induced   Relieved by:  Nothing Worsened by:  Nothing tried Ineffective treatments:  None tried Associated symptoms: abdominal pain, arthralgias and chills   Associated symptoms: no diarrhea, no fever, no headaches and no myalgias   Abdominal pain:    Location:  Epigastric   Quality:  Aching and bloating   Severity:  Moderate   Onset quality:  Sudden   Duration:  10 hours   Timing:  Constant   Progression:  Waxing and waning   Chronicity:  New Risk factors: sick contacts   Risk factors: no alcohol use, no diabetes, no prior abdominal surgery, no suspect food intake and no travel to endemic areas   Arm Pain This is a new problem. The problem occurs constantly. The problem has been resolved.  Associated symptoms include abdominal pain. Pertinent negatives include no chest pain, no headaches and no shortness of breath.    Past Medical History  Diagnosis Date  . ALLERGIC RHINITIS 12/31/2007  . ANXIETY 11/30/2006  . BAKER'S CYST, RIGHT KNEE 11/30/2006  . BURSITIS, LEFT HIP 12/31/2007  . Cardiomegaly 05/08/2010  . DEGENERATIVE JOINT DISEASE 11/30/2006  . GASTROENTERITIS WITHOUT DEHYDRATION 06/06/2007  . GERD 11/30/2006  . Headache(784.0) 10/22/2008  . HYPERCHOLESTEROLEMIA 11/30/2006  . HYPERLIPIDEMIA 04/16/2007  . HYPERTENSION 11/30/2006  . OSTEOPOROSIS 10/22/2008  . PERIPHERAL VASCULAR DISEASE 04/16/2007  . 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 VITAMIN D DEFICIENCY 05/08/2010  . Anxiety   . Allergic rhinitis   . Hiatal hernia     with Schatzki's ring  . Diverticulosis   . DJD (degenerative joint disease) of knee     bilateral  . Hemorrhoids   . Carotid artery occlusion 2010  . Complication of anesthesia     very easily sedated-due to hx polio   Past Surgical History  Procedure Laterality Date  . S/p right first finger t rigger finger    . S/p right cea feb 2010      Dr Kellie Simmering  . Breast biopsy  1992    right  . Tonsillectomy and adenoidectomy  age 89  . Left ankle surgery  1949    x 2  . Left great toe fusion  1956  . Endometrial biopsy  1994  . Colonoscopy  2003; 11/2310    2003:External hemorrhoids 2012: diverticulosis, external hemorrhoids  . Carotid endarterectomy  2010  . Post polio Left 1949, 1956  . Calcaneal osteotomy Left 08/28/2012    Procedure: LEFT FOOT DORSAL EXOSTECTOMY;  Surgeon: Wylene Simmer, MD;  Location: [facility];  Service: Orthopedics;  Laterality: Left;   Family History  Problem Relation Age of Onset  . Stroke Father     mother, MGM  . Hypertension Father     mother, MGM  . Deep vein thrombosis Father   . Heart disease Father   . Osteoporosis Mother   . Anemia Mother   . Hypertension Mother   . Stroke Mother   . Leukemia Paternal  Grandmother     MGF  . Heart attack      uncle  . Cirrhosis      uncle  . Throat cancer      uncle  . Breast cancer Maternal Aunt   . Arthritis Maternal Grandmother   . Colon cancer Neg Hx    History  Substance Use Topics  . Smoking status: Never Smoker   . Smokeless tobacco: Never Used  . Alcohol Use: Yes     Comment: occ glass of wine    OB History   Grav Para Term Preterm Abortions TAB SAB Ect Mult Living                 Review of Systems  Constitutional: Positive for chills. Negative for fever.  Respiratory: Negative for shortness of breath.   Cardiovascular: Negative for chest pain.  Gastrointestinal: Positive for vomiting and abdominal pain. Negative for diarrhea.  Musculoskeletal: Positive for arthralgias. Negative for back pain, myalgias and neck pain.  Skin: Negative for rash.  Neurological: Negative for headaches.  All other systems reviewed and are negative.      Allergies  Hydromorphone hcl; Latex; and Sulfonamide derivatives  Home Medications   Current Outpatient Rx  Name  Route  Sig  Dispense  Refill  . acetaminophen (TYLENOL) 325 MG tablet   Oral   Take 650 mg by mouth every 6 (six) hours as needed for mild pain.         Marland Kitchen aspirin 81 MG EC tablet   Oral   Take 81 mg by mouth at bedtime.          Marland Kitchen esomeprazole (NEXIUM) 40 MG capsule   Oral   Take 1 capsule (40 mg total) by mouth daily. As needed   90 capsule   3   . estradiol (ESTRACE VAGINAL) 0.1 MG/GM vaginal cream   Vaginal   Place 1 Applicatorful vaginally 2 (two) times a week. Tues, Thurs         . fluticasone (FLONASE) 50 MCG/ACT nasal spray   Nasal   Place 2 sprays into the nose daily.   16 g   4   . ibuprofen (ADVIL,MOTRIN) 200 MG tablet   Oral   Take 200 mg by mouth every 6 (six) hours as needed for moderate pain.         Vladimir Faster Glycol-Propyl Glycol (SYSTANE ULTRA) 0.4-0.3 % SOLN   Ophthalmic   Apply 1 drop to eye 2 (two) times daily as needed (dry eye).          . rosuvastatin (CRESTOR) 20 MG tablet   Oral   Take 1 tablet (20 mg total) by mouth daily.   30 tablet   6   . valsartan (  DIOVAN) 320 MG tablet   Oral   Take 1 tablet (320 mg total) by mouth daily. Take 1/2 if blood pressure is under 140   90 tablet   3   . ondansetron (ZOFRAN-ODT) 8 MG disintegrating tablet   Oral   Take 1 tablet (8 mg total) by mouth every 12 (twelve) hours as needed for nausea.   20 tablet   0    BP 127/62  Pulse 71  Temp(Src) 98.6 F (37 C) (Oral)  Resp 12  SpO2 100% Physical Exam  Nursing note and vitals reviewed. Constitutional: She appears well-developed and well-nourished. No distress.  HENT:  Head: Normocephalic and atraumatic.  Mouth/Throat: Oropharynx is clear and moist.  Eyes: Conjunctivae and EOM are normal. No scleral icterus.  Neck: Normal range of motion. Neck supple. No JVD present.  Cardiovascular: Normal rate, regular rhythm and intact distal pulses.   Pulmonary/Chest: Effort normal. No respiratory distress. She has no wheezes.  Abdominal: Soft. She exhibits no distension. There is tenderness. There is no rebound and no guarding.  Musculoskeletal: She exhibits no edema.  Neurological: She is alert. She exhibits normal muscle tone. Coordination normal.  Skin: Skin is warm and dry. No rash noted. She is not diaphoretic.  Psychiatric: She has a normal mood and affect.    ED Course  Procedures (including critical care time) Labs Review Labs Reviewed  CBC WITH DIFFERENTIAL - Abnormal; Notable for the following:    Neutrophils Relative % 94 (*)    Lymphocytes Relative 3 (*)    Lymphs Abs 0.2 (*)    All other components within normal limits  COMPREHENSIVE METABOLIC PANEL - Abnormal; Notable for the following:    Glucose, Bld 132 (*)    GFR calc non Af Amer 82 (*)    All other components within normal limits  TROPONIN I  LIPASE, BLOOD   Imaging Review No results found.  EKG Interpretation    Date/Time:  Saturday  May 23 2013 12:39:00 EST Ventricular Rate:  78 PR Interval:  169 QRS Duration: 67 QT Interval:  421 QTC Calculation: 480 R Axis:   -17 Text Interpretation:  Sinus rhythm Borderline left axis deviation Low voltage, precordial leads no sig change compared to ECG on 06/05/05 Borderline ECG Confirmed by [facility]  MD, MICHEAL ([id number]) on 05/23/2013 12:42:39 PM           RA sat is 96% and I interpret to be adequate   12:42 PM Pt's initial ECG had artifact.  Repeat ECG shows no sig ischemic changes.     2:00 PM Labs ok, pt feels much beter, rested, no further vomiting, abd is soft, no tenderness, able to tolerate liquids, will d/c home MDM   Final diagnoses:  Nausea and vomiting in adult  Epigastric pain    Pt with epigastric crampy pains followed by N/V times 3-4 times.  Nausea now, no sig pain.  No CP.  Will get ECG, troponin, but I doubt ACS,very atypical.  Symptoms began at 0300, so 1 troponin should rule out.  I suspect possibly a GI bug given likely sick contacts.  Will treat symptoms and IVF's and reassess.      Saddie Benders. Anjalee Cope, MD 05/23/13 1400

## 2013-05-25 ENCOUNTER — Telehealth: Payer: Self-pay | Admitting: *Deleted

## 2013-05-25 NOTE — Telephone Encounter (Signed)
[facility] Triage Call Report Triage Record Num: [id number] Operator: Gypsy DecantLeigh Martin Patient Name: Christina RimBarbara [name] Call Date & Time: 05/23/2013 9:49:18AM Patient Phone: [phone] PCP: Oliver BarreJames John Patient Gender: Female PCP Fax : [phone] Patient DOB: 1934-08-22 Practice Name: [facility] Reason for Call: Caller: Janet BerlinBarbara/Patient; PCP: [name], [name] (Adults only); CB#: [phone]; Call regarding Vomiting; Onset 05/22/2013; Vomited x3, she is also having body aches; Her left arm is also hurting. She got a massage and worked out yesterday. She is having chills right now. She has had Polio and states she does have aching in that arm periodically, so this is not unusual for her, however, she feels the aching was a little worse. Temp is 99.0; BP is 155/78 P-92; Emergent s/sx of Nausea or Vomiting, Signs of Dehydration identified. ER advised d/t patients symptoms. Stressed to patient the importance of being evaluated today for possible cardiac injury. Discussed at length the dangers of having a heart attack and not seeking treatment. Patient states that although she does experience some aching in her arm from time to time, this aching was much worse and she has never had this wake her before. She had a vegetarian pizza last night for dinner, she mentioned that she wondered if she just got some food poisoning. Advised pt this is highly unlikely based on what she ate, however, not totally impossible. Pt will go to the ER for evaluation. Protocol(s) Used: Nausea or Vomiting Recommended Outcome per Protocol: See ED Immediately Reason for Outcome: Signs of dehydration Care Advice: ~ Another adult should drive. ~ IMMEDIATE ACTION Take sips of clear liquids (such as water, clear fruit juices without pulp, soda, tea or coffee without dairy or non-dairy creamer, clear broth or bouillon, oral hydration solution, or plain gelatin, fruit ices/popsicles, hard candy) as tolerated. Sucking on ice chips  is another option. ~ 02/

## 2013-06-04 ENCOUNTER — Encounter: Payer: Medicare Other | Admitting: Internal Medicine

## 2013-06-15 ENCOUNTER — Ambulatory Visit: Payer: Medicare Other | Admitting: Family

## 2013-06-15 ENCOUNTER — Other Ambulatory Visit (HOSPITAL_COMMUNITY): Payer: Medicare Other

## 2013-06-16 ENCOUNTER — Other Ambulatory Visit: Payer: Medicare Other

## 2013-06-16 ENCOUNTER — Ambulatory Visit: Payer: Medicare Other | Admitting: Neurosurgery

## 2013-06-23 ENCOUNTER — Ambulatory Visit (INDEPENDENT_AMBULATORY_CARE_PROVIDER_SITE_OTHER): Payer: Medicare HMO | Admitting: Internal Medicine

## 2013-06-23 ENCOUNTER — Encounter: Payer: Self-pay | Admitting: Family

## 2013-06-23 ENCOUNTER — Encounter: Payer: Self-pay | Admitting: Internal Medicine

## 2013-06-23 VITALS — BP 140/84 | HR 78 | Temp 98.4°F | Ht 61.0 in | Wt 174.4 lb

## 2013-06-23 DIAGNOSIS — Z23 Encounter for immunization: Secondary | ICD-10-CM

## 2013-06-23 DIAGNOSIS — Z0000 Encounter for general adult medical examination without abnormal findings: Secondary | ICD-10-CM

## 2013-06-23 MED ORDER — VALSARTAN 320 MG PO TABS: ORAL_TABLET | ORAL | Status: DC

## 2013-06-23 NOTE — Patient Instructions (Addendum)
You had the new Prevnar pneumonia shot today  Please continue all other medications as before, and refills have been done if requested. Please have the pharmacy call with any other refills you may need.  Please continue your efforts at being more active, low cholesterol diet, and weight control. You are otherwise up to date with prevention measures today. Please keep your appointments with your specialists as you may have planned  Please go to the LAB in the Basement (turn left off the elevator) for the tests to be done today You will be contacted by phone if any changes need to be made immediately.  Otherwise, you will receive a letter about your results with an explanation, but please check with MyChart first.  Please return in 1 year for your yearly visit, or sooner if needed, with Lab testing done 3-5 days before

## 2013-06-23 NOTE — Progress Notes (Signed)
Pre visit review using our clinic review tool, if applicable. No additional management support is needed unless otherwise documented below in the visit note. 

## 2013-06-23 NOTE — Addendum Note (Signed)
Addended by: Scharlene GlossEWING, Max Romano B on: 06/23/2013 06:00 PM   Modules accepted: Orders

## 2013-06-23 NOTE — Progress Notes (Signed)
Subjective:    Patient ID: Christina [name], female    DOB: 03/15/1935, 79 y.o.   MRN: [id number]  HPI  Here for wellness and f/u;  Overall doing ok;  Pt denies CP, worsening SOB, DOE, wheezing, orthopnea, PND, worsening LE edema, palpitations, dizziness or syncope.  Pt denies neurological change such as new headache, facial or extremity weakness.  Pt denies polydipsia, polyuria, or low sugar symptoms. Pt states overall good compliance with treatment and medications, good tolerability, and has been trying to follow lower cholesterol diet.  Pt denies worsening depressive symptoms, suicidal ideation or panic. No fever, night sweats, wt loss, loss of appetite, or other constitutional symptoms.  Pt states good ability with ADL's, has low fall risk, home safety reviewed and adequate, no other significant changes in hearing or vision, and only occasionally active with exercise.  Seen in ER with prob enterisits with n/v (no diarrhea) and resolved with zofran and several days time. Past Medical History  Diagnosis Date  . ALLERGIC RHINITIS 12/31/2007  . ANXIETY 11/30/2006  . BAKER'S CYST, RIGHT KNEE 11/30/2006  . BURSITIS, LEFT HIP 12/31/2007  . Cardiomegaly 05/08/2010  . DEGENERATIVE JOINT DISEASE 11/30/2006  . GASTROENTERITIS WITHOUT DEHYDRATION 06/06/2007  . GERD 11/30/2006  . Headache(784.0) 10/22/2008  . HYPERCHOLESTEROLEMIA 11/30/2006  . HYPERLIPIDEMIA 04/16/2007  . HYPERTENSION 11/30/2006  . OSTEOPOROSIS 10/22/2008  . PERIPHERAL VASCULAR DISEASE 04/16/2007  . 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 VITAMIN D DEFICIENCY 05/08/2010  . Anxiety   . Allergic rhinitis   . Hiatal hernia     with Schatzki's ring  . Diverticulosis   . DJD (degenerative joint disease) of knee     bilateral  . Hemorrhoids   . Carotid artery occlusion 2010  . Complication of anesthesia     very easily sedated-due to hx polio   Past Surgical History  Procedure Laterality Date  . S/p right first finger t rigger finger    . S/p right  cea feb 2010      Dr Hart [place]  . Breast biopsy  1992    right  . Tonsillectomy and adenoidectomy  age 79  . Left ankle surgery  1949    x 2  . Left great toe fusion  1956  . Endometrial biopsy  1994  . Colonoscopy  2003; 11/2310    2003:External hemorrhoids 2012: diverticulosis, external hemorrhoids  . Carotid endarterectomy  2010  . Post polio Left 1949, 1956  . Calcaneal osteotomy Left 08/28/2012    Procedure: LEFT FOOT DORSAL EXOSTECTOMY;  Surgeon: Toni ArthursJohn Hewitt, MD;  [facility];  Service: Orthopedics;  Laterality: Left;    reports that she has never smoked. She has never used smokeless tobacco. She reports that she drinks alcohol. She reports that she does not use illicit drugs. family history includes Anemia in her mother; Arthritis in her maternal grandmother; Breast cancer in her maternal aunt; Cirrhosis in an other family member; Deep vein thrombosis in her father; Heart attack in an other family member; Heart disease in her father; Hypertension in her father and mother; Leukemia in her paternal grandmother; Osteoporosis in her mother; Stroke in her father and mother; Throat cancer in an other family member. There is no history of Colon cancer. Allergies  Allergen Reactions  . Hydromorphone Hcl Anaphylaxis  . Latex Rash  . Sulfonamide Derivatives Rash   Current Outpatient Prescriptions on File Prior to Visit  Medication Sig Dispense Refill  . acetaminophen (TYLENOL) 325 MG tablet Take 650 mg  by mouth every 6 (six) hours as needed for mild pain.      Marland Kitchen aspirin 81 MG EC tablet Take 81 mg by mouth at bedtime.       Marland Kitchen esomeprazole (NEXIUM) 40 MG capsule Take 1 capsule (40 mg total) by mouth daily. As needed  90 capsule  3  . estradiol (ESTRACE VAGINAL) 0.1 MG/GM vaginal cream Place 1 Applicatorful vaginally 2 (two) times a week. Tues, Thurs      . fluticasone (FLONASE) 50 MCG/ACT nasal spray Place 2 sprays into the nose daily.  16 g  4  . ibuprofen (ADVIL,MOTRIN)  200 MG tablet Take 200 mg by mouth every 6 (six) hours as needed for moderate pain.      Bertram Gala Glycol-Propyl Glycol (SYSTANE ULTRA) 0.4-0.3 % SOLN Apply 1 drop to eye 2 (two) times daily as needed (dry eye).      . rosuvastatin (CRESTOR) 20 MG tablet Take 1 tablet (20 mg total) by mouth daily.  30 tablet  6  . valsartan (DIOVAN) 320 MG tablet Take 1 tablet (320 mg total) by mouth daily. Take 1/2 if blood pressure is under 140  90 tablet  3   No current facility-administered medications on file prior to visit.   Review of Systems Constitutional: Negative for diaphoresis, activity change, appetite change or unexpected weight change.  HENT: Negative for hearing loss, ear pain, facial swelling, mouth sores and neck stiffness.   Eyes: Negative for pain, redness and visual disturbance.  Respiratory: Negative for shortness of breath and wheezing.   Cardiovascular: Negative for chest pain and palpitations.  Gastrointestinal: Negative for diarrhea, blood in stool, abdominal distention or other pain Genitourinary: Negative for hematuria, flank pain or change in urine volume.  Musculoskeletal: Negative for myalgias and joint swelling.  Skin: Negative for color change and wound.  Neurological: Negative for syncope and numbness. other than noted Hematological: Negative for adenopathy.  Psychiatric/Behavioral: Negative for hallucinations, self-injury, decreased concentration and agitation.      Objective:   Physical Exam BP 140/84  Pulse 78  Temp(Src) 98.4 F (36.9 C) (Oral)  Ht 5\' 1"  (1.549 m)  Wt 174 lb 6 oz (79.096 kg)  BMI 32.96 kg/m2  SpO2 95% VS noted,  Constitutional: Pt is oriented to person, place, and time. Appears well-developed and well-nourished.  Head: Normocephalic and atraumatic.  Right Ear: External ear normal.  Left Ear: External ear normal.  Nose: Nose normal.  Mouth/Throat: Oropharynx is clear and moist.  Eyes: Conjunctivae and EOM are normal. Pupils are equal,  round, and reactive to light.  Neck: Normal range of motion. Neck supple. No JVD present. No tracheal deviation present.  Cardiovascular: Normal rate, regular rhythm, normal heart sounds and intact distal pulses.   Pulmonary/Chest: Effort normal and breath sounds normal.  Abdominal: Soft. Bowel sounds are normal. There is no tenderness. No HSM  Musculoskeletal: Normal range of motion. Exhibits no edema.  Lymphadenopathy:  Has no cervical adenopathy.  Neurological: Pt is alert and oriented to person, place, and time. Pt has normal reflexes. No cranial nerve deficit.  Skin: Skin is warm and dry. No rash noted.  Psychiatric:  Has  normal mood and affect. Behavior is normal.     Assessment & Plan:

## 2013-06-23 NOTE — Assessment & Plan Note (Signed)

## 2013-06-24 ENCOUNTER — Ambulatory Visit (INDEPENDENT_AMBULATORY_CARE_PROVIDER_SITE_OTHER): Payer: Medicare HMO | Admitting: Family

## 2013-06-24 ENCOUNTER — Other Ambulatory Visit (INDEPENDENT_AMBULATORY_CARE_PROVIDER_SITE_OTHER): Payer: Medicare HMO

## 2013-06-24 ENCOUNTER — Encounter: Payer: Self-pay | Admitting: Family

## 2013-06-24 ENCOUNTER — Ambulatory Visit (HOSPITAL_COMMUNITY)
Admission: RE | Admit: 2013-06-24 | Discharge: 2013-06-24 | Disposition: A | Discharge status: Home / Self Care | Payer: Medicare HMO | Source: Ambulatory Visit | Attending: Family | Admitting: Family

## 2013-06-24 VITALS — BP 171/77 | HR 66 | Resp 14 | Ht 61.0 in | Wt 174.0 lb

## 2013-06-24 DIAGNOSIS — I10 Essential (primary) hypertension: Secondary | ICD-10-CM

## 2013-06-24 DIAGNOSIS — I6529 Occlusion and stenosis of unspecified carotid artery: Secondary | ICD-10-CM | POA: Insufficient documentation

## 2013-06-24 DIAGNOSIS — Z0000 Encounter for general adult medical examination without abnormal findings: Secondary | ICD-10-CM

## 2013-06-24 DIAGNOSIS — Z48812 Encounter for surgical aftercare following surgery on the circulatory system: Secondary | ICD-10-CM | POA: Insufficient documentation

## 2013-06-24 LAB — HEPATIC FUNCTION PANEL
ALT: 20 U/L (ref 0–35)
AST: 20 U/L (ref 0–37)
Albumin: 4.6 g/dL (ref 3.5–5.2)
Alkaline Phosphatase: 43 U/L (ref 39–117)
Bilirubin, Direct: 0.1 mg/dL (ref 0.0–0.3)
Total Bilirubin: 0.7 mg/dL (ref 0.3–1.2)
Total Protein: 7.7 g/dL (ref 6.0–8.3)

## 2013-06-24 LAB — BASIC METABOLIC PANEL
BUN: 18 mg/dL (ref 6–23)
CO2: 28 mEq/L (ref 19–32)
Calcium: 9.5 mg/dL (ref 8.4–10.5)
Chloride: 106 mEq/L (ref 96–112)
Creatinine, Ser: 0.6 mg/dL (ref 0.4–1.2)
GFR: 99.19 mL/min (ref >60.00)
Glucose, Bld: 87 mg/dL (ref 70–99)
Potassium: 4.3 mEq/L (ref 3.5–5.1)
Sodium: 140 mEq/L (ref 135–145)

## 2013-06-24 LAB — URINALYSIS, ROUTINE W REFLEX MICROSCOPIC
BILIRUBIN URINE: NEGATIVE
Hgb urine dipstick: NEGATIVE
Leukocytes, UA: NEGATIVE
NITRITE: NEGATIVE
PH: 5.5 (ref 5.0–8.0)
RBC / HPF: NONE SEEN (ref 0–?)
TOTAL PROTEIN, URINE-UPE24: NEGATIVE
Urine Glucose: NEGATIVE
Urobilinogen, UA: 0.2 (ref 0.0–1.0)

## 2013-06-24 LAB — CBC WITH DIFFERENTIAL/PLATELET
Basophils Absolute: 0 10*3/uL (ref 0.0–0.1)
Basophils Relative: 0.5 % (ref 0.0–3.0)
Eosinophils Absolute: 0.1 10*3/uL (ref 0.0–0.7)
Eosinophils Relative: 1.5 % (ref 0.0–5.0)
HEMATOCRIT: 36.1 % (ref 36.0–46.0)
HEMOGLOBIN: 12.1 g/dL (ref 12.0–15.0)
LYMPHS ABS: 1.8 10*3/uL (ref 0.7–4.0)
Lymphocytes Relative: 23.7 % (ref 12.0–46.0)
MCHC: 33.5 g/dL (ref 30.0–36.0)
MCV: 92.8 fl (ref 78.0–100.0)
MONO ABS: 0.6 10*3/uL (ref 0.1–1.0)
Monocytes Relative: 7.5 % (ref 3.0–12.0)
Neutro Abs: 5 10*3/uL (ref 1.4–7.7)
Neutrophils Relative %: 66.8 % (ref 43.0–77.0)
Platelets: 214 10*3/uL (ref 150.0–400.0)
RBC: 3.9 Mil/uL (ref 3.87–5.11)
RDW: 14.3 % (ref 11.5–14.6)
WBC: 7.4 10*3/uL (ref 4.5–10.5)

## 2013-06-24 LAB — TSH: TSH: 1.36 u[IU]/mL (ref 0.35–5.50)

## 2013-06-24 LAB — LIPID PANEL
CHOLESTEROL: 129 mg/dL (ref 0–200)
HDL: 63.1 mg/dL (ref >39.00)
LDL CALC: 53 mg/dL (ref 0–99)
Total CHOL/HDL Ratio: 2
Triglycerides: 65 mg/dL (ref 0.0–149.0)
VLDL: 13 mg/dL (ref 0.0–40.0)

## 2013-06-24 NOTE — Progress Notes (Signed)
Established Carotid Patient   History of Present Illness  Christina [name] is a 79 y.o. female patient of Dr. Hart Rochester status post right CEA in 2010. She returns today for routine surveillance.  She denies history of MI, denies history of stroke or TIA. Had her yearly exam with her PCP yesterday. Does water exercises 3x/week.  The patient denies amaurosis fugax or monocular blindness.  The patient  denies facial drooping.  Pt. denies hemiplegia.  The patient denies receptive or expressive aphasia.  Pt. denies extremity weakness.  She has occasional aching and numbness in her left arm and mild left leg weakness as residual effects from having had polio at age 76. Seen at [facility] ED recently for vomiting, no etiology found.   Patient denies New Medical or Surgical History.  Pt Diabetic: No Pt smoker: non-smoker  Pt meds include: Statin : Yes ASA: Yes Other anticoagulants/antiplatelets: no   Past Medical History  Diagnosis Date  . ALLERGIC RHINITIS 12/31/2007  . ANXIETY 11/30/2006  . BAKER'S CYST, RIGHT KNEE 11/30/2006  . BURSITIS, LEFT HIP 12/31/2007  . Cardiomegaly 05/08/2010  . DEGENERATIVE JOINT DISEASE 11/30/2006  . GASTROENTERITIS WITHOUT DEHYDRATION 06/06/2007  . GERD 11/30/2006  . Headache(784.0) 10/22/2008  . HYPERCHOLESTEROLEMIA 11/30/2006  . HYPERLIPIDEMIA 04/16/2007  . HYPERTENSION 11/30/2006  . OSTEOPOROSIS 10/22/2008  . PERIPHERAL VASCULAR DISEASE 04/16/2007  . 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 VITAMIN D DEFICIENCY 05/08/2010  . Anxiety   . Allergic rhinitis   . Hiatal hernia     with Schatzki's ring  . Diverticulosis   . DJD (degenerative joint disease) of knee     bilateral  . Hemorrhoids   . Carotid artery occlusion 2010  . Complication of anesthesia     very easily sedated-due to hx polio    Social History History  Substance Use Topics  . Smoking status: Never Smoker   . Smokeless tobacco: Never Used  . Alcohol Use: Yes     Comment: occ glass of wine      Family History Family History  Problem Relation Age of Onset  . Stroke Father     mother, MGM  . Hypertension Father     mother, MGM  . Deep vein thrombosis Father   . Heart disease Father   . Osteoporosis Mother   . Anemia Mother   . Hypertension Mother   . Stroke Mother   . Leukemia Paternal Grandmother     MGF  . Heart attack      uncle  . Cirrhosis      uncle  . Throat cancer      uncle  . Breast cancer Maternal Aunt   . Arthritis Maternal Grandmother   . Colon cancer Neg Hx     Surgical History Past Surgical History  Procedure Laterality Date  . S/p right first finger t rigger finger    . S/p right cea feb 2010      Dr Hart Rochester  . Breast biopsy  1992    right  . Tonsillectomy and adenoidectomy  age 6  . Left ankle surgery  1949    x 2  . Left great toe fusion  1956  . Endometrial biopsy  1994  . Colonoscopy  2003; 11/2310    2003:External hemorrhoids 2012: diverticulosis, external hemorrhoids  . Post polio Left 1949, 1956  . Calcaneal osteotomy Left 08/28/2012    Procedure: LEFT FOOT DORSAL EXOSTECTOMY;  Surgeon: Toni Arthurs, MD;  Location: [facility];  Service: Orthopedics;  Laterality: Left;  . Carotid endarterectomy Right 05-26-08    cea    Allergies  Allergen Reactions  . Hydromorphone Hcl Anaphylaxis  . Latex Rash  . Sulfonamide Derivatives Rash    Current Outpatient Prescriptions  Medication Sig Dispense Refill  . acetaminophen (TYLENOL) 325 MG tablet Take 650 mg by mouth every 6 (six) hours as needed for mild pain.      Marland Kitchen aspirin 81 MG EC tablet Take 81 mg by mouth at bedtime.       Marland Kitchen esomeprazole (NEXIUM) 40 MG capsule Take 1 capsule (40 mg total) by mouth daily. As needed  90 capsule  3  . estradiol (ESTRACE VAGINAL) 0.1 MG/GM vaginal cream Place 1 Applicatorful vaginally 2 (two) times a week. Tues, Thurs      . fluticasone (FLONASE) 50 MCG/ACT nasal spray Place 2 sprays into the nose daily.  16 g  4  . ibuprofen  (ADVIL,MOTRIN) 200 MG tablet Take 200 mg by mouth every 6 (six) hours as needed for moderate pain.      Bertram Gala Glycol-Propyl Glycol (SYSTANE ULTRA) 0.4-0.3 % SOLN Apply 1 drop to eye 2 (two) times daily as needed (dry eye).      . rosuvastatin (CRESTOR) 20 MG tablet Take 1 tablet (20 mg total) by mouth daily.  30 tablet  6  . valsartan (DIOVAN) 320 MG tablet Take 1/2 - 1 tab by mouth blood pressure is over 140  90 tablet  3   No current facility-administered medications for this visit.    Review of Systems : See HPI for pertinent positives and negatives.  Physical Examination  Filed Vitals:   06/24/13 1124  BP: 171/77  Pulse: 66  Resp: 14   Filed Weights   06/24/13 1124  Weight: 174 lb (78.926 kg)   Body mass index is 32.89 kg/(m^2).  General: WDWN obese female in NAD GAIT: normal, uses a cane Eyes: PERRLA Pulmonary:  Non-labored, CTAB, Negative  Rales, Negative rhonchi, & Negative wheezing.  Cardiac: regular Rhythm ,  Negative detected murmur.  VASCULAR EXAM Carotid Bruits Left Right   Negative Negative    Radial pulses are 2+ palpable and equal.                                                                                                                            LE Pulses LEFT RIGHT       POPLITEAL  not palpable   not palpable    Gastrointestinal: soft, nontender, BS WNL, no r/g,  negative masses.  Musculoskeletal: Negative muscle atrophy/wasting. M/S 5/5 in UE's, 4/5 in LE's, Extremities without ischemic changes.  Neurologic: A&O X 3; Appropriate Affect ; SENSATION ;normal;  Speech is normal CN 2-12 intact, Pain and light touch intact in extremities, Motor exam as listed above.   Non-Invasive Vascular Imaging CAROTID DUPLEX 06/24/2013   CEREBROVASCULAR DUPLEX EVALUATION    INDICATION: Carotid stenosis    PREVIOUS INTERVENTION(S): Right carotid endarterectomy 05/26/2008.  DUPLEX EXAM:     RIGHT  LEFT  Peak Systolic Velocities (cm/s) End  Diastolic Velocities (cm/s) Plaque LOCATION Peak Systolic Velocities (cm/s) End Diastolic Velocities (cm/s) Plaque  116 24  CCA PROXIMAL 141 30   50 11  CCA MID 101 24   59 15  CCA DISTAL 92 26 HT  75 9  ECA 84 9 HT  73 22  ICA PROXIMAL 90 20 HT  106 34  ICA MID 89 27   96 30  ICA DISTAL 65 17     carotid endarterectomy ICA / CCA Ratio (PSV) 0.89  Antegrade Vertebral Flow Antegrade  176 Brachial Systolic Pressure (mmHg) 164  Triphasic Brachial Artery Waveforms Triphasic    Plaque Morphology:  HM = Homogeneous, HT = Heterogeneous, CP = Calcific Plaque, SP = Smooth Plaque, IP = Irregular Plaque     ADDITIONAL FINDINGS:     IMPRESSION: Right internal carotid artery is patent with history of carotid endarterectomy, no hyperplasia or hemodynamically significant plaque is visualized. Left internal carotid artery stenosis of less than 40% present.    Compared to the previous exam:  Unchanged since previous study on 06/10/2012.     Assessment: Christina [name] is a 79 y.o. female who is status post right CEA in 2010. She presents with asymptomatic patent right ICA and minimal stenosis in left ICA. The  ICA stenosis is  Unchanged from previous exam.  Plan: Follow-up in 1 year with Carotid Duplex scan.   I discussed in depth with the patient the nature of atherosclerosis, and emphasized the importance of maximal medical management including strict control of blood pressure, blood glucose, and lipid levels, obtaining regular exercise, and continued cessation of smoking.  The patient is aware that without maximal medical management the underlying atherosclerotic disease process will progress, limiting the benefit of any interventions. The patient was given information about stroke prevention and what symptoms should prompt the patient to seek immediate medical care. Thank you for allowing us to participate in this patient's care.  Charisse MarchSuzanne Nickel, RN, MSN, FNP-C [facility] [place] Office: [phone]  Clinic Physician: Edilia BoDickson  06/24/2013 11:30 AM

## 2013-06-24 NOTE — Patient Instructions (Signed)

## 2013-06-25 NOTE — Addendum Note (Signed)
Addended by: Adria DillELDRIDGE-LEWIS, Davinder Haff L on: 06/25/2013 01:09 PM   Modules accepted: Orders

## 2013-07-14 ENCOUNTER — Other Ambulatory Visit: Payer: Self-pay

## 2013-07-14 MED ORDER — VALSARTAN 320 MG PO TABS: ORAL_TABLET | ORAL | Status: DC

## 2013-07-15 ENCOUNTER — Other Ambulatory Visit: Payer: Self-pay | Admitting: Internal Medicine

## 2013-07-22 ENCOUNTER — Other Ambulatory Visit: Payer: Self-pay

## 2013-07-22 MED ORDER — ROSUVASTATIN CALCIUM 20 MG PO TABS: 20.0000 mg | ORAL_TABLET | Freq: Every day | ORAL | Status: DC

## 2013-08-20 ENCOUNTER — Other Ambulatory Visit: Payer: Self-pay | Admitting: Dermatology

## 2013-09-24 ENCOUNTER — Other Ambulatory Visit: Payer: Self-pay | Admitting: Dermatology

## 2013-10-15 ENCOUNTER — Other Ambulatory Visit: Payer: Self-pay

## 2013-10-15 MED ORDER — ROSUVASTATIN CALCIUM 20 MG PO TABS: 20.0000 mg | ORAL_TABLET | Freq: Every day | ORAL | Status: DC

## 2013-10-22 ENCOUNTER — Other Ambulatory Visit: Payer: Self-pay

## 2013-10-22 DIAGNOSIS — Z1231 Encounter for screening mammogram for malignant neoplasm of breast: Secondary | ICD-10-CM

## 2013-10-27 ENCOUNTER — Ambulatory Visit
Admission: RE | Admit: 2013-10-27 | Discharge: 2013-10-27 | Disposition: A | Discharge status: Home / Self Care | Payer: Medicare HMO | Source: Ambulatory Visit

## 2013-10-27 DIAGNOSIS — Z1231 Encounter for screening mammogram for malignant neoplasm of breast: Secondary | ICD-10-CM

## 2014-01-08 ENCOUNTER — Ambulatory Visit (INDEPENDENT_AMBULATORY_CARE_PROVIDER_SITE_OTHER): Payer: Medicare HMO

## 2014-01-08 DIAGNOSIS — Z23 Encounter for immunization: Secondary | ICD-10-CM

## 2014-01-26 ENCOUNTER — Ambulatory Visit (INDEPENDENT_AMBULATORY_CARE_PROVIDER_SITE_OTHER): Payer: Medicare HMO | Admitting: Internal Medicine

## 2014-01-26 ENCOUNTER — Encounter: Payer: Self-pay | Admitting: Internal Medicine

## 2014-01-26 VITALS — BP 130/86 | HR 79 | Temp 98.2°F | Ht 61.0 in | Wt 171.0 lb

## 2014-01-26 DIAGNOSIS — M25511 Pain in right shoulder: Secondary | ICD-10-CM

## 2014-01-26 DIAGNOSIS — I10 Essential (primary) hypertension: Secondary | ICD-10-CM

## 2014-01-26 DIAGNOSIS — E7800 Pure hypercholesterolemia, unspecified: Secondary | ICD-10-CM

## 2014-01-26 MED ORDER — OMEPRAZOLE 20 MG PO CPDR: 20.0000 mg | DELAYED_RELEASE_CAPSULE | Freq: Every day | ORAL | Status: DC

## 2014-01-26 MED ORDER — NAPROXEN 500 MG PO TABS: 500.0000 mg | ORAL_TABLET | Freq: Two times a day (BID) | ORAL | Status: DC

## 2014-01-26 MED ORDER — ROSUVASTATIN CALCIUM 20 MG PO TABS: 20.0000 mg | ORAL_TABLET | Freq: Every day | ORAL | Status: AC

## 2014-01-26 NOTE — Progress Notes (Signed)
Subjective:    Patient ID: Christina [name], female    DOB: May 11, 1934, 79 y.o.   MRN: [id number]  HPI  Here with 1 mo worsening right shoulder, looking back may have started somewhat with playing with grandkids last April. Tx incidentally for bronchitis with cipro per UC about that time as well.  Has seen Dr Teressa SenterSypher (now retired) with right frozen shoulder and known bone spurs, some better with PT, but now soreness to ant right shoulder, mild, intermittent, worse to palpate, as well as forward elevate and lateral abduction. Nothing seems to make better.  Also right sciatica has been intermittent, mild, none current, but off and on several months, tingling only to right leg. Pt denies chest pain, increased sob or doe, wheezing, orthopnea, PND, increased LE swelling, palpitations, dizziness or syncope. Past Medical History  Diagnosis Date  . ALLERGIC RHINITIS 12/31/2007  . ANXIETY 11/30/2006  . BAKER'S CYST, RIGHT KNEE 11/30/2006  . BURSITIS, LEFT HIP 12/31/2007  . Cardiomegaly 05/08/2010  . DEGENERATIVE JOINT DISEASE 11/30/2006  . GASTROENTERITIS WITHOUT DEHYDRATION 06/06/2007  . GERD 11/30/2006  . Headache(784.0) 10/22/2008  . HYPERCHOLESTEROLEMIA 11/30/2006  . HYPERLIPIDEMIA 04/16/2007  . HYPERTENSION 11/30/2006  . OSTEOPOROSIS 10/22/2008  . PERIPHERAL VASCULAR DISEASE 04/16/2007  . 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 VITAMIN D DEFICIENCY 05/08/2010  . Anxiety   . Allergic rhinitis   . Hiatal hernia     with Schatzki's ring  . Diverticulosis   . DJD (degenerative joint disease) of knee     bilateral  . Hemorrhoids   . Carotid artery occlusion 2010  . Complication of anesthesia     very easily sedated-due to hx polio   Past Surgical History  Procedure Laterality Date  . S/p right first finger t rigger finger    . S/p right cea feb 2010      Dr Hart Rochesterlawson  . Breast biopsy  1992    right  . Tonsillectomy and adenoidectomy  age 79  . Left ankle surgery  1949    x 2  . Left great toe fusion  1956    . Endometrial biopsy  1994  . Colonoscopy  2003; 11/2310    2003:External hemorrhoids 2012: diverticulosis, external hemorrhoids  . Post polio Left 1949, 1956  . Calcaneal osteotomy Left 08/28/2012    Procedure: LEFT FOOT DORSAL EXOSTECTOMY;  Surgeon: Toni ArthursJohn Hewitt, MD;  Location: [facility];  Service: Orthopedics;  Laterality: Left;  . Carotid endarterectomy Right 05-26-08    cea    reports that she has never smoked. She has never used smokeless tobacco. She reports that she drinks alcohol. She reports that she does not use illicit drugs. family history includes Anemia in her mother; Arthritis in her maternal grandmother; Breast cancer in her maternal aunt; Cirrhosis in an other family member; Deep vein thrombosis in her father; Heart attack in an other family member; Heart disease in her father; Hypertension in her father and mother; Leukemia in her paternal grandmother; Osteoporosis in her mother; Stroke in her father and mother; Throat cancer in an other family member. There is no history of Colon cancer. Allergies  Allergen Reactions  . Hydromorphone Hcl Anaphylaxis  . Latex Rash  . Sulfonamide Derivatives Rash   Current Outpatient Prescriptions on File Prior to Visit  Medication Sig Dispense Refill  . acetaminophen (TYLENOL) 325 MG tablet Take 650 mg by mouth every 6 (six) hours as needed for mild pain.      Marland Kitchen. aspirin 81 MG  EC tablet Take 81 mg by mouth at bedtime.       Marland Kitchen. DIOVAN 320 MG tablet take 1 tablet by mouth once daily (TAKE 1/2 TABLET IF BLOOD PRESSURE IS UNDER 140)  90 tablet  3  . esomeprazole (NEXIUM) 40 MG capsule Take 1 capsule (40 mg total) by mouth daily. As needed  90 capsule  3  . estradiol (ESTRACE VAGINAL) 0.1 MG/GM vaginal cream Place 1 Applicatorful vaginally 2 (two) times a week. Tues, Thurs      . fluticasone (FLONASE) 50 MCG/ACT nasal spray Place 2 sprays into the nose daily.  16 g  4  . Polyethyl Glycol-Propyl Glycol (SYSTANE ULTRA) 0.4-0.3 % SOLN  Apply 1 drop to eye 2 (two) times daily as needed (dry eye).       No current facility-administered medications on file prior to visit.   Review of Systems  Constitutional: Negative for unusual diaphoresis or other sweats  HENT: Negative for ringing in ear Eyes: Negative for double vision or worsening visual disturbance.  Respiratory: Negative for choking and stridor.   Gastrointestinal: Negative for vomiting or other signifcant bowel change Genitourinary: Negative for hematuria or decreased urine volume.  Musculoskeletal: Negative for other MSK pain or swelling Skin: Negative for color change and worsening wound.  Neurological: Negative for tremors and numbness other than noted  Psychiatric/Behavioral: Negative for decreased concentration or agitation other than above       Objective:   Physical Exam BP 130/86  Pulse 79  Temp(Src) 98.2 F (36.8 C) (Oral)  Ht 5\' 1"  (1.549 m)  Wt 171 lb (77.565 kg)  BMI 32.33 kg/m2  SpO2 97% VS noted, not ill appearing Constitutional: Pt appears well-developed, well-nourished.  HENT: Head: NCAT.  Right Ear: External ear normal.  Left Ear: External ear normal.  Eyes: . Pupils are equal, round, and reactive to light. Conjunctivae and EOM are normal Neck: Normal range of motion. Neck supple.  Cardiovascular: Normal rate and regular rhythm.   Pulmonary/Chest: Effort normal and breath sounds normal.  Tender right bicep and bicipital tendon insertion site Neurological: Pt is alert. Not confused , motor grossly intact Skin: Skin is warm. No rash Psychiatric: Pt behavior is normal. No agitation.     Assessment & Plan:

## 2014-01-26 NOTE — Assessment & Plan Note (Signed)
stable overall by history and exam, recent data reviewed with pt, and pt to continue medical treatment as before,  to f/u any worsening symptoms or concerns' Lab Results  Component Value Date   LDLCALC 53 06/24/2013   For refill crestor, lower chol diet

## 2014-01-26 NOTE — Assessment & Plan Note (Signed)
stable overall by history and exam, recent data reviewed with pt, and pt to continue medical treatment as before,  to f/u any worsening symptoms or concerns BP Readings from Last 3 Encounters:  01/26/14 130/86  06/24/13 171/77  06/23/13 140/84

## 2014-01-26 NOTE — Progress Notes (Signed)
Pre visit review using our clinic review tool, if applicable. No additional management support is needed unless otherwise documented below in the visit note. 

## 2014-01-26 NOTE — Assessment & Plan Note (Signed)
cw prob bicipital tenodonitis, for nsaid prn, prilosec 20 qd prn nsaid use, and refer Dr Smith/sport med - ? Need cortisone

## 2014-01-26 NOTE — Patient Instructions (Signed)
Please take all new medication as prescribed- the anti-inflammatory, and the prilosec with it to help protect the stomach (once per day)  Please continue all other medications as before, and refills have been done if requested- the crestor  You will be contacted regarding the referral for: Dr Smith/sport medicine  Please have the pharmacy call with any other refills you may need  Please keep your appointments with your specialists as you may have planned

## 2014-01-29 ENCOUNTER — Other Ambulatory Visit (INDEPENDENT_AMBULATORY_CARE_PROVIDER_SITE_OTHER): Payer: Medicare HMO

## 2014-01-29 ENCOUNTER — Encounter: Payer: Self-pay | Admitting: Family Medicine

## 2014-01-29 ENCOUNTER — Ambulatory Visit (INDEPENDENT_AMBULATORY_CARE_PROVIDER_SITE_OTHER): Payer: Medicare HMO | Admitting: Family Medicine

## 2014-01-29 VITALS — BP 138/84 | HR 79 | Ht 61.0 in | Wt 171.0 lb

## 2014-01-29 DIAGNOSIS — M7521 Bicipital tendinitis, right shoulder: Secondary | ICD-10-CM

## 2014-01-29 DIAGNOSIS — M25511 Pain in right shoulder: Secondary | ICD-10-CM

## 2014-01-29 DIAGNOSIS — M7520 Bicipital tendinitis, unspecified shoulder: Secondary | ICD-10-CM | POA: Insufficient documentation

## 2014-01-29 DIAGNOSIS — M67911 Unspecified disorder of synovium and tendon, right shoulder: Secondary | ICD-10-CM

## 2014-01-29 NOTE — Progress Notes (Signed)
Tawana ScaleZach Delmar Arriaga D.O. [facility] [address] [place], [place] [address] Phone: [phone] Subjective:    I'm seeing this patient by the request  of:  Oliver BarreJames John, MD   CC: Shoulder pain, right  OZH:[id number]:Subjective Christina BirksBarbara A [name] is a 79 y.o. female coming in with complaint of right shoulder pain. Patient states that she has had one month of pain. Patient states approximately 6 months ago when playing with her grandkids is when she did notice some mild discomfort but over the course last week it has gotten significantly worse. Patient has been diagnosed with right frozen shoulder in the past treated patient has known osteophytic changes of the shoulder she states. Patient did go to formal physical therapy at that time and did improve. Patient states that this pain seems to be a little bit different with pain more on the anterior aspect the shoulder with pain with certain movements especially lateral abduction. Patient has tried some home modalities with no significant improvement. Patient states that this is making it very difficult for her to sleep at night and is waking her up. Patient states that there is no significant radiation but she does have pain about midway down her arm. Patient states certain repetitive activity or holding something heavy can be uncomfortable. Patient states that she still has trouble actually reaching above her head. Patient rates the severity of 5/10. Denies any numbness in the extremity. No pain with the contralateral side.     Past medical history, social, surgical and family history all reviewed in electronic medical record.   Review of Systems: No headache, visual changes, nausea, vomiting, diarrhea, constipation, dizziness, abdominal pain, skin rash, fevers, chills, night sweats, weight loss, swollen lymph nodes, body aches, joint swelling, muscle aches, chest pain, shortness of breath, mood changes.   Objective Blood pressure 138/84, pulse 79, height 5'  1" (1.549 m), weight 171 lb (77.565 kg), SpO2 98.00%.  General: No apparent distress alert and oriented x3 mood and affect normal, dressed appropriately.  HEENT: Pupils equal, extraocular movements intact  Respiratory: Patient's speak in full sentences and does not appear short of breath  Cardiovascular: No lower extremity edema, non tender, no erythema  Skin: Warm dry intact with no signs of infection or rash on extremities or on axial skeleton.  Abdomen: Soft nontender  Neuro: Cranial nerves II through XII are intact, neurovascularly intact in all extremities with 2+ DTRs and 2+ pulses.  Lymph: No lymphadenopathy of posterior or anterior cervical chain or axillae bilaterally.  Gait normal with good balance and coordination.  MSK:  Non tender with full range of motion and good stability and symmetric strength and tone of  elbows, wrist, hip, knee and ankles bilaterally.  Shoulder: Right Inspection reveals no abnormalities, atrophy or asymmetry. Palpation is normal with no tenderness over AC joint or bicipital groove. ROM is full in all planes passively. Rotator cuff strength normal throughout. signs of impingement with positive Neer and Hawkin's tests, but negative empty can sign. Speeds and Yergason's tests normal. No labral pathology noted with negative Obrien's, negative clunk and good stability. Normal scapular function observed. No painful arc and no drop arm sign. No apprehension sign  MSK US performed of: Right This study was ordered, performed, and interpreted by Terrilee FilesZach Merrie Epler D.O.  Shoulder:   Supraspinatus:  Significant calcific changes with chronic tear noted but no significant retraction. Bursal bulge seen with shoulder abduction on impingement view. Infraspinatus:  Appears normal on long and transverse views. Significant increase  in Doppler flow Subscapularis:  Appears normal on long and transverse views. Positive bursa Teres Minor:  Appears normal on long and transverse  views. AC joint:  Capsule undistended, no geyser sign. Glenohumeral Joint:  Appears normal without effusion. Glenoid Labrum:  Intact without visualized tears. Biceps Tendon:  Moderate hypoechoic changes noted of the bicep tendon with calcific changes but no true tear.  Impression: Subacromial bursitis with calcific rotator cuff tendinopathy as well as biceps tendinitis.  Procedure: Real-time Ultrasound Guided Injection of right glenohumeral joint Device: GE Logiq E  Ultrasound guided injection is preferred based studies that show increased duration, increased effect, greater accuracy, decreased procedural pain, increased response rate with ultrasound guided versus blind injection.  Verbal informed consent obtained.  Time-out conducted.  Noted no overlying erythema, induration, or other signs of local infection.  Skin prepped in a sterile fashion.  Local anesthesia: Topical Ethyl chloride.  With sterile technique and under real time ultrasound guidance:  Joint visualized.  23g 1  inch needle inserted posterior approach. Pictures taken for needle placement. Patient did have injection of 2 cc of 1% lidocaine, 2 cc of 0.5% Marcaine, and 1.0 cc of Kenalog 40 mg/dL. Completed without difficulty  Pain immediately resolved suggesting accurate placement of the medication.  Advised to call if fevers/chills, erythema, induration, drainage, or persistent bleeding.  Images permanently stored and available for review in the ultrasound unit.  Impression: Technically successful ultrasound guided injection.     Impression and Recommendations:     This case required medical decision making of moderate complexity.

## 2014-01-29 NOTE — Assessment & Plan Note (Signed)
Discussed compression, icing and HEP.   Patient will try this and if no improvement we will consider ultrasound guided injection.

## 2014-01-29 NOTE — Assessment & Plan Note (Signed)
Patient does have calcific changes of the rotator cuff with chronic tearing noted the patient does have full strength. We discussed that patient would try some over-the-counter medications to help with some of the pain. We discussed the patient can use the tramadol as needed. We did discuss the patient home exercises and showed proper technique as well as the patient handout. Patient did tolerate the injection very well today. Patient will try these interventions and come back and see me again in 3 weeks. If continuing to have pain we may need to consider a bicep tendon injection.

## 2014-01-29 NOTE — Patient Instructions (Signed)
Good to see you Ice 20 minutes 2 times daily. Usually after activity and before bed. Exercises 3 times a week.  Take tylenol 650 mg three times a day is the best evidence based medicine we have for arthritis.  Glucosamine sulfate 750mg  twice a day is a supplement that has been shown to help moderate to severe arthritis. Vitamin D 4000 IU daily Fish oil 2 grams daily.  Tumeric 500mg  twice daily.  Capsaicin topically up to four times a day may also help with pain. Cortisone injections are an option if these interventions do not seem to make a difference or need more relief.  Come back and see me in 3 weeks.

## 2014-02-23 ENCOUNTER — Encounter: Payer: Self-pay | Admitting: Family Medicine

## 2014-02-23 ENCOUNTER — Ambulatory Visit (INDEPENDENT_AMBULATORY_CARE_PROVIDER_SITE_OTHER): Payer: Medicare HMO | Admitting: Family Medicine

## 2014-02-23 VITALS — BP 138/72 | HR 81 | Ht 61.0 in | Wt 172.0 lb

## 2014-02-23 DIAGNOSIS — S7001XA Contusion of right hip, initial encounter: Secondary | ICD-10-CM

## 2014-02-23 DIAGNOSIS — S7000XA Contusion of unspecified hip, initial encounter: Secondary | ICD-10-CM | POA: Insufficient documentation

## 2014-02-23 DIAGNOSIS — M67911 Unspecified disorder of synovium and tendon, right shoulder: Secondary | ICD-10-CM

## 2014-02-23 MED ORDER — DOXYCYCLINE HYCLATE 100 MG PO TABS: 100.0000 mg | ORAL_TABLET | Freq: Two times a day (BID) | ORAL | Status: AC

## 2014-02-23 NOTE — Patient Instructions (Signed)
Good to see you You are doing great.  Continue the exercises 3 times a week.  Ice is still your friend at night especially.  Continue the vitamins.  Arnica lotion to the bruise will be helpful.  Doxycycloine 2 times daily for next 2 weeks.  See me again in 4 weeks if pain is not gone.

## 2014-02-23 NOTE — Assessment & Plan Note (Signed)
Continues to respond well. Icing Patient also has gone into home exercises on a regular basis and encouraged it is more frequently. Discussed icing protocol. Patient will come back and see me again in 3-4 weeks to make sure she continues to improve.

## 2014-02-23 NOTE — Assessment & Plan Note (Signed)
Discussed arnica gel. Discussed icing protocol, discussed that if pain worsens to come back for further evaluation.

## 2014-02-23 NOTE — Progress Notes (Signed)
  Zach Maysin Carstens D.O. [facility] [address] [place], [place] [address] Phone: [phone] Subjective:    CC: Shoulder pain, right follow up  VHQ:IONGEXBMWUHPI:Subjective Christina BirksBarbara A [name] is a 79 y.o. female coming in with complaint of right shoulder pain. Patient was seen previously and was diagnosed with more of a subacromial bursitis.patient HAD SOME CALCIFIC CHANGES OF THE ROTATOR CUFF. PATIENT WAS GIVEN 9 INTRA-ARTICULAR INJECTION. PATIENT WAS TO DO HOME EXERCISES BUT HAS NOT BEEN DOING THIS RELIGIOUSLY. PATIENT IS DOING THE OVER-THE-COUNTER VITAMINS WITH SIGNIFICANT IMPROVEMENT. PATIENT STATES THAT SHE IS APPROXIMATELY 80% BETTER. DENIES ANY NEW SYMPTOMS. ABLE TO BECOME TROUBLE AT NIGHT.   Patient though did have a recent fall falling on the right buttocks area. Patient states since then and has a significant amount of bruising. Denies any radiation down the leg. States that it only hurts when she hits this area. Has noticed some mild improvement. This occurred approximately 1-1/2 week ago.    Past medical history, social, surgical and family history all reviewed in electronic medical record.   Review of Systems: No headache, visual changes, nausea, vomiting, diarrhea, constipation, dizziness, abdominal pain, skin rash, fevers, chills, night sweats, weight loss, swollen lymph nodes, body aches, joint swelling, muscle aches, chest pain, shortness of breath, mood changes.   Objective Blood pressure 138/72, pulse 81, height 5\' 1"  (1.549 m), weight 172 lb (78.019 kg), SpO2 96 %.  General: No apparent distress alert and oriented x3 mood and affect normal, dressed appropriately.  HEENT: Pupils equal, extraocular movements intact  Respiratory: Patient's speak in full sentences and does not appear short of breath  Cardiovascular: No lower extremity edema, non tender, no erythema  Skin: Warm dry intact with no signs of infection or rash on extremities or on axial skeleton. Patient's posterior  lateral buttocks area has a large hematoma that is approximately 6 cm in diameter. Seems to be resolving. No hard area. Full range of motion of patient's hip. Abdomen: Soft nontender  Neuro: Cranial nerves II through XII are intact, neurovascularly intact in all extremities with 2+ DTRs and 2+ pulses.  Lymph: No lymphadenopathy of posterior or anterior cervical chain or axillae bilaterally.  Gait normal with good balance and coordination.  MSK:  Non tender with full range of motion and good stability and symmetric strength and tone of  elbows, wrist, hip, knee and ankles bilaterally.  Shoulder:right Inspection reveals no abnormalities, atrophy or asymmetry. Palpation is normal with no tenderness over AC joint or bicipital groove. ROM is full in all planes. Rotator cuff strength normal throughout. No signs of impingement with negative Neer and Hawkin's tests, empty can sign. Speeds and Yergason's tests normal. No labral pathology noted with negative Obrien's, negative clunk and good stability. Normal scapular function observed. No painful arc and no drop arm sign. No apprehension sign  Contralateral shoulder unremarkable    Impression and Recommendations:     This case required medical decision making of moderate complexity.

## 2014-03-23 ENCOUNTER — Ambulatory Visit (INDEPENDENT_AMBULATORY_CARE_PROVIDER_SITE_OTHER)
Admission: RE | Admit: 2014-03-23 | Discharge: 2014-03-23 | Disposition: A | Discharge status: Home / Self Care | Payer: Medicare HMO | Source: Ambulatory Visit | Attending: Family Medicine | Admitting: Family Medicine

## 2014-03-23 ENCOUNTER — Ambulatory Visit (INDEPENDENT_AMBULATORY_CARE_PROVIDER_SITE_OTHER): Payer: Medicare HMO | Admitting: Family Medicine

## 2014-03-23 ENCOUNTER — Other Ambulatory Visit (INDEPENDENT_AMBULATORY_CARE_PROVIDER_SITE_OTHER): Payer: Medicare HMO

## 2014-03-23 ENCOUNTER — Encounter: Payer: Self-pay | Admitting: Family Medicine

## 2014-03-23 VITALS — BP 112/68 | HR 80 | Ht 61.0 in | Wt 173.0 lb

## 2014-03-23 DIAGNOSIS — E559 Vitamin D deficiency, unspecified: Secondary | ICD-10-CM

## 2014-03-23 DIAGNOSIS — M67911 Unspecified disorder of synovium and tendon, right shoulder: Secondary | ICD-10-CM

## 2014-03-23 DIAGNOSIS — M25511 Pain in right shoulder: Secondary | ICD-10-CM

## 2014-03-23 DIAGNOSIS — M810 Age-related osteoporosis without current pathological fracture: Secondary | ICD-10-CM

## 2014-03-23 DIAGNOSIS — M25569 Pain in unspecified knee: Secondary | ICD-10-CM

## 2014-03-23 LAB — FERRITIN: Ferritin: 57 ng/mL (ref 10.0–291.0)

## 2014-03-23 LAB — VITAMIN D 25 HYDROXY (VIT D DEFICIENCY, FRACTURES): VITD: 33.02 ng/mL (ref 30.00–100.00)

## 2014-03-23 MED ORDER — GABAPENTIN 100 MG PO CAPS: 100.0000 mg | ORAL_CAPSULE | Freq: Every day | ORAL | Status: DC

## 2014-03-23 NOTE — Progress Notes (Signed)
  Tawana ScaleZach Quyen Cutsforth D.O. [facility] [address] [place], [place] [address] Phone: [phone]([phone] Subjective:    CC: Shoulder pain, right follow up  BJY:NWGNFAOZHYHPI:Subjective Christina BirksBarbara A [name] is a 79 y.o. female coming in with complaint of right shoulder pain. Patient was seen previously and was diagnosed with more of a subacromial bursitis.  Patient was seen previously and did have a calcific bursitis. Patient had an injection greater than 2 months ago. Patient continued on the conservative therapy. Patient states some of the pain is starting to return slowly. Patient states that it's mostly at night. Denies any radiation down the arm. Denies any numbness or tingling. States that it feels very similar to 2 months prior to the injection but significantly better than prior to the injection. It has seemed to increase in pain over the course of the last month. Patient continues with the conservative therapy and has been doing the exercises fairly religiously.       Past medical history, social, surgical and family history all reviewed in electronic medical record.   Review of Systems: No headache, visual changes, nausea, vomiting, diarrhea, constipation, dizziness, abdominal pain, skin rash, fevers, chills, night sweats, weight loss, swollen lymph nodes, body aches, joint swelling, muscle aches, chest pain, shortness of breath, mood changes.   Objective There were no vitals taken for this visit.  General: No apparent distress alert and oriented x3 mood and affect normal, dressed appropriately.  HEENT: Pupils equal, extraocular movements intact  Respiratory: Patient's speak in full sentences and does not appear short of breath  Cardiovascular: No lower extremity edema, non tender, no erythema  Skin: Warm dry intact with no signs of infection or rash on extremities or on axial skeleton. Patient's posterior lateral buttocks area has a large hematoma that is approximately 6 cm in diameter. Seems to be  resolving. No hard area. Full range of motion of patient's hip. Abdomen: Soft nontender  Neuro: Cranial nerves II through XII are intact, neurovascularly intact in all extremities with 2+ DTRs and 2+ pulses.  Lymph: No lymphadenopathy of posterior or anterior cervical chain or axillae bilaterally.  Gait normal with good balance and coordination.  MSK:  Non tender with full range of motion and good stability and symmetric strength and tone of  elbows, wrist, hip, knee and ankles bilaterally.  Shoulder:right Inspection reveals no abnormalities, atrophy or asymmetry. Palpation is normal with no tenderness over AC joint or bicipital groove. ROM is full in all planes. Rotator cuff strength 4+ out of 5 compared to 5 out of 5 on the contralateral side Mild increase in impingement signs from previous exam Speeds and Yergason's tests normal. No labral pathology noted with negative Obrien's, negative clunk and good stability. Normal scapular function observed. No painful arc and no drop arm sign. No apprehension sign  Contralateral shoulder unremarkable    Impression and Recommendations:     This case required medical decision making of moderate complexity.

## 2014-03-23 NOTE — Patient Instructions (Addendum)
Good to see you.   Happy holidays!  Continue the exercises but take 3 days off.  Ice is your friend Try the pennsaid twice daily as needed, pinkie amount.  Continue the vitamin D and we will check values today.  xrays today to evaluate bone spurs.  Try gabapentin at night to help with the pain as well.  See me again in 3 weeks.

## 2014-03-23 NOTE — Assessment & Plan Note (Signed)
Discussed with patient that patient did have calcific changes which does take longer to heal. Patient had an injection 2 months ago and would like to wait 1 more month before attempting another injection. Patient's postpolio syndrome can also be affecting this. We discussed checking vitamin D as well as iron levels to see if this is causing difficulty with healing. X-rays of the shoulder ordered today as well. We discussed continuing the other conservative therapies. Cervical radiculopathy is within the differential and started patient on 100 mg of gabapentin at night to see if this will help with the sleep as well as the pain. This could also help with her post polio syndrome. We discussed side effects. Patient then is going to come back and see me again in 3-4 weeks. At that time if continuing to have difficulty we can consider an injection again.  Spent greater than 25 minutes with patient face-to-face and had greater than 50% of counseling including as described above in assessment and plan.

## 2014-04-13 ENCOUNTER — Encounter: Payer: Self-pay | Admitting: Family Medicine

## 2014-04-13 ENCOUNTER — Encounter: Payer: Self-pay | Admitting: Internal Medicine

## 2014-04-13 ENCOUNTER — Ambulatory Visit (INDEPENDENT_AMBULATORY_CARE_PROVIDER_SITE_OTHER): Payer: Medicare HMO | Admitting: Family Medicine

## 2014-04-13 VITALS — BP 134/80 | HR 84 | Ht 61.0 in | Wt 174.0 lb

## 2014-04-13 DIAGNOSIS — M19011 Primary osteoarthritis, right shoulder: Secondary | ICD-10-CM

## 2014-04-13 MED ORDER — TRAMADOL HCL 50 MG PO TABS: 50.0000 mg | ORAL_TABLET | Freq: Every evening | ORAL | Status: DC | PRN

## 2014-04-13 NOTE — Progress Notes (Signed)
  Tawana ScaleZach Issacc Merlo D.O. [facility] [address] [place], [place] [address] Phone: [phone]([phone] Subjective:    CC: Shoulder pain, right follow up  BJY:NWGNFAOZHYHPI:Subjective [name] is a 80 y.o. female coming in with complaint of right shoulder pain. Patient was seen previously and was diagnosed with more of a subacromial bursitis.  Patient was seen previously and did have a calcific bursitis. Patient had an injection greater than 3 months ago. Patient also had x-rays after last exam that did show the patient does have severe osteophytic changes of the shoulder joint itself. Patient's cervical neck x-rays show mild degenerative changes. Patient states that if she does a lot of activity she continues to have the discomfort in her shoulder. Patient has started the gabapentin with no significant improvement. Patient states that still at night it seems to be more of a dull throbbing pain that is the most concerning. Patient does have the pain during the day but at this point is not stopping her from any activities. Patient has augmented how she does certain activities felt. Denies any new symptoms she is somewhat worsening of previous symptoms. Patient continues the over-the-counter medications as well as the Tylenol more on an as-needed basis and scheduled dosing.        Past medical history, social, surgical and family history all reviewed in electronic medical record.   Review of Systems: No headache, visual changes, nausea, vomiting, diarrhea, constipation, dizziness, abdominal pain, skin rash, fevers, chills, night sweats, weight loss, swollen lymph nodes, body aches, joint swelling, muscle aches, chest pain, shortness of breath, mood changes.   Objective Blood pressure 134/80, weight 174 lb (78.926 kg).  General: No apparent distress alert and oriented x3 mood and affect normal, dressed appropriately.  HEENT: Pupils equal, extraocular movements intact  Respiratory: Patient's speak in full  sentences and does not appear short of breath  Cardiovascular: No lower extremity edema, non tender, no erythema  Skin: Warm dry intact with no signs of infection or rash on extremities or on axial skeleton.  Abdomen: Soft nontender  Neuro: Cranial nerves II through XII are intact, neurovascularly intact in all extremities with 2+ DTRs and 2+ pulses.  Lymph: No lymphadenopathy of posterior or anterior cervical chain or axillae bilaterally.  Gait normal with good balance and coordination.  MSK:  Non tender with full range of motion and good stability and symmetric strength and tone of  elbows, wrist, hip, knee and ankles bilaterally.  Shoulder:right Inspection reveals no abnormalities, atrophy or asymmetry. Palpation is normal with no tenderness over AC joint or bicipital groove. ROM is full in all planes. Rotator cuff strength 4+ out of 5 compared to 5 out of 5 on the contralateral side Continued impingement signs Speeds and Yergason's tests normal. No labral pathology noted with negative Obrien's, negative clunk and good stability. Normal scapular function observed. No painful arc and no drop arm sign. No apprehension sign  Contralateral shoulder unremarkable  No significant change from previous exam    Impression and Recommendations:     This case required medical decision making of moderate complexity.

## 2014-04-13 NOTE — Patient Instructions (Signed)
Good to see you Ice is your friend Try gabapentin 200mg  at night Tramadol 1/2 tab at night Continue the tylenol and vitamins.   Vitamin D is doing well but continue 4000IU daily.  Iron is great See me again in 3 weeks and if in pain will do injection.

## 2014-04-13 NOTE — Assessment & Plan Note (Signed)
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 Patient did have a tendinopathy of the rotator cuff but I do not believe that it is fully worn. Patient did respond previously 10 injection for short course of time. Patient is having difficulty with the conservative therapy at this time. Patient has already gone to formal physical therapy with mild improvement. I do believe that patient's postpolio syndrome is likely also contributing. Discussed again about over-the-counter medicines and doing this more on a scheduled basis as well as patient was given a prescription for tramadol for breakthrough pain. Patient will increase her gabapentin to 200 mg at night with her not having any side effects and hopefully this will be beneficial. Patient will try these different changes and come back in 3 weeks. At that time if continuing to have discomfort we'll consider a repeat intra-articular injection. Patient continues to have difficulty responding we may need to consider viscous upper mentation area and patient failed all other conservative therapy including injections listed above patient may need surgical invention which would likely be a shoulder replacement surgery.  All questions were answered today. We went over the x-rays together in great detail.  Spent greater than 25 minutes with patient face-to-face and had greater than 50% of counseling including as described above in assessment and plan.

## 2014-04-18 NOTE — Addendum Note (Signed)
Addended by: Corwin LevinsJOHN, [name] W on: 04/18/2014 06:40 PM   Modules accepted: Kipp BroodSmartSet

## 2014-05-04 ENCOUNTER — Encounter: Payer: Self-pay | Admitting: Family Medicine

## 2014-05-04 ENCOUNTER — Ambulatory Visit (INDEPENDENT_AMBULATORY_CARE_PROVIDER_SITE_OTHER): Payer: Medicare HMO | Admitting: Family Medicine

## 2014-05-04 VITALS — BP 142/78 | HR 85 | Ht 61.0 in | Wt 173.0 lb

## 2014-05-04 DIAGNOSIS — M19011 Primary osteoarthritis, right shoulder: Secondary | ICD-10-CM

## 2014-05-04 NOTE — Progress Notes (Signed)
Pre visit review using our clinic review tool, if applicable. No additional management support is needed unless otherwise documented below in the visit note. 

## 2014-05-04 NOTE — Assessment & Plan Note (Signed)
Patient was given an injection today. We discussed icing regimen and home exercises. We discussed continuing the over-the-counter natural supplementations. We discussed the proper ways to do certain activities. Patient will continue with the gabapentin at night and does have tramadol for breakthrough pain. Patient will follow-up with me again in 3-4 weeks. If continuing to have pain we may one to consider viscous supplementation. Patient will check with insurance to see if this would be potentially covered. Otherwise we can repeat steroid injections every 3-4 months.  Spent  25 minutes with patient face-to-face and had greater than 50% of counseling including as described above in assessment and plan.

## 2014-05-04 NOTE — Progress Notes (Signed)
Tawana ScaleZach Adelina [name] D.O. [facility] [address] [place], [place] [address] Phone: [phone] Subjective:    CC: Shoulder pain, right follow up  GLO:VFIEPPIRJJHPI:Subjective Christina BirksBarbara A [name] is a 80 y.o. female coming in with complaint of right shoulder pain. Patient was seen previously and was diagnosed with more of a subacromial bursitis.  Patient was seen previously and did have a calcific bursitis.  Patient also had x-rays after last exam that did show the patient does have severe osteophytic changes of the shoulder joint itself. Patient's cervical neck x-rays show mild degenerative changes.  Patient elected to continue conservative therapy and was given gabapentin at night. Patient did have tramadol for breakthrough pain. Patient was encouraged to stay active and continue with the home exercises. Patient states at this time she continues to have some pain. Patient is doing the exercises very regularly. Patient states that the pain is nothing that stops her from activities but does give her discomfort. Patient did have good response to the first injection greater than 3 months ago. Patient is wondering if there is a possibility of having another one. Denies any new symptoms just worsening of previous symptoms.        Past medical history, social, surgical and family history all reviewed in electronic medical record.   Review of Systems: No headache, visual changes, nausea, vomiting, diarrhea, constipation, dizziness, abdominal pain, skin rash, fevers, chills, night sweats, weight loss, swollen lymph nodes, body aches, joint swelling, muscle aches, chest pain, shortness of breath, mood changes.   Objective Blood pressure 142/78, pulse 85, height 5\' 1"  (1.549 m), weight 173 lb (78.472 kg), SpO2 95 %.  General: No apparent distress alert and oriented x3 mood and affect normal, dressed appropriately.  HEENT: Pupils equal, extraocular movements intact  Respiratory: Patient's speak in full sentences  and does not appear short of breath  Cardiovascular: No lower extremity edema, non tender, no erythema  Skin: Warm dry intact with no signs of infection or rash on extremities or on axial skeleton.  Abdomen: Soft nontender  Neuro: Cranial nerves II through XII are intact, neurovascularly intact in all extremities with 2+ DTRs and 2+ pulses.  Lymph: No lymphadenopathy of posterior or anterior cervical chain or axillae bilaterally.  Gait normal with good balance and coordination.  MSK:  Non tender with full range of motion and good stability and symmetric strength and tone of  elbows, wrist, hip, knee and ankles bilaterally.  Shoulder:right Inspection reveals no abnormalities, atrophy or asymmetry. Palpation is normal with no tenderness over AC joint or bicipital groove. ROM is full in all planes. Rotator cuff strength 4+ out of 5 compared to 5 out of 5 on the contralateral side Continued impingement signs Speeds and Yergason's tests normal. No labral pathology noted with negative Obrien's, negative clunk and good stability. Normal scapular function observed. No painful arc and no drop arm sign. No apprehension sign  Contralateral shoulder unremarkable  No significant change from previous exam  Procedure: Real-time Ultrasound Guided Injection of right glenohumeral joint Device: GE Logiq E  Ultrasound guided injection is preferred based studies that show increased duration, increased effect, greater accuracy, decreased procedural pain, increased response rate with ultrasound guided versus blind injection.  Verbal informed consent obtained.  Time-out conducted.  Noted no overlying erythema, induration, or other signs of local infection.  Skin prepped in a sterile fashion.  Local anesthesia: Topical Ethyl chloride.  With sterile technique and under real time ultrasound guidance:  Joint visualized.  23g  1  inch needle inserted posterior approach. Pictures taken for needle placement.  Patient did have injection of 2 cc of 1% lidocaine, 2 cc of 0.5% Marcaine, and 1.0 cc of Kenalog 40 mg/dL. Completed without difficulty  Pain immediately resolved suggesting accurate placement of the medication.  Advised to call if fevers/chills, erythema, induration, drainage, or persistent bleeding.  Images permanently stored and available for review in the ultrasound unit.  Impression: Technically successful ultrasound guided injection.    Impression and Recommendations:     This case required medical decision making of moderate complexity.

## 2014-05-04 NOTE — Patient Instructions (Signed)
Good to see you Ice is your friend Continue the exercises Keep up with the medicines and will refill when you need.  See me again in 1-3 months.

## 2014-05-10 DIAGNOSIS — J189 Pneumonia, unspecified organism: Secondary | ICD-10-CM

## 2014-05-10 HISTORY — DX: Pneumonia, unspecified organism: J18.9

## 2014-06-01 ENCOUNTER — Telehealth: Payer: Self-pay | Admitting: Internal Medicine

## 2014-06-01 ENCOUNTER — Ambulatory Visit: Payer: Self-pay | Admitting: Family Medicine

## 2014-06-01 NOTE — Telephone Encounter (Signed)
TELEPHONE ADVICE RECORD [facility]  Patient Name: Christina [name]  DOB: 30-Nov-1934    Initial Comment 1 of 2 Caller states her and her husband has a cough, chills, and feeling bad, thinks it is the flu   Nurse Assessment  Nurse: Odis LusterBowers, RN, Bjorn Loserhonda Date/Time (Eastern Time): 06/01/2014 3:40:03 PM  Confirm and document reason for call. If symptomatic, describe symptoms. ---Caller states that she has a cough and hurts her back when she coughs. She reports that she is having some wheezing and noisy breathing. Also reports that she had diarrhea x1 and symptoms began on Sunday. Denies fever. Reports that she is coughing up a small amount of phlegm. Reports that she has body aches, but she does has a hx of polio.  Has the patient traveled out of the country within the last 30 days? ---Not Applicable  Does the patient require triage? ---Yes  Related visit to physician within the last 2 weeks? ---No   Reports that she has had a flu shot for this year.  Does the PT have any chronic conditions? (i.e. diabetes, asthma, etc.) ---Yes  List chronic conditions. ---hypertension; hyperlipidemia     Guidelines    Guideline Title Affirmed Question Affirmed Notes  Common Cold Earache    Final Disposition User   See Physician within 24 Hours SeldenBowers, RN, HiltoniaRhonda    Comments  Made caller an appt with Dr. Antoine PrimasZachary Smith on 06/02/14 @ 10:15 am, caller voiced understanding.

## 2014-06-02 ENCOUNTER — Ambulatory Visit (INDEPENDENT_AMBULATORY_CARE_PROVIDER_SITE_OTHER)
Admission: RE | Admit: 2014-06-02 | Discharge: 2014-06-02 | Disposition: A | Discharge status: Home / Self Care | Payer: Medicare HMO | Source: Ambulatory Visit | Attending: Internal Medicine | Admitting: Internal Medicine

## 2014-06-02 ENCOUNTER — Telehealth: Payer: Self-pay | Admitting: *Deleted

## 2014-06-02 ENCOUNTER — Encounter: Payer: Self-pay | Admitting: Internal Medicine

## 2014-06-02 ENCOUNTER — Ambulatory Visit (INDEPENDENT_AMBULATORY_CARE_PROVIDER_SITE_OTHER): Payer: Medicare HMO | Admitting: Internal Medicine

## 2014-06-02 VITALS — BP 140/90 | HR 80 | Temp 99.0°F | Wt 171.0 lb

## 2014-06-02 DIAGNOSIS — J189 Pneumonia, unspecified organism: Secondary | ICD-10-CM

## 2014-06-02 MED ORDER — LEVOFLOXACIN 500 MG PO TABS: 500.0000 mg | ORAL_TABLET | Freq: Every day | ORAL | Status: DC

## 2014-06-02 MED ORDER — PROMETHAZINE-CODEINE 6.25-10 MG/5ML PO SYRP: 5.0000 mL | ORAL_SOLUTION | ORAL | Status: DC | PRN

## 2014-06-02 NOTE — Progress Notes (Signed)
   Subjective:    Patient ID: Christina BirksBarbara A Yo, female    DOB: [birth year], 80 y.o.   MRN: [id number]  Cough Associated symptoms include rhinorrhea.  URI  This is a new problem. The current episode started in the past 7 days. The problem has been rapidly worsening. The maximum temperature recorded prior to her arrival was 100.4 - 100.9 F. Associated symptoms include congestion, coughing, rhinorrhea and sinus pain. Pertinent negatives include no abdominal pain. She has tried antihistamine for the symptoms. The treatment provided no relief.      Review of Systems  HENT: Positive for congestion and rhinorrhea.   Respiratory: Positive for cough.   Gastrointestinal: Negative for abdominal pain.       Objective:   Physical Exam  Constitutional: She appears well-developed. No distress.  HENT:  Head: Normocephalic.  Right Ear: External ear normal.  Left Ear: External ear normal.  Nose: Nose normal.  Mouth/Throat: Oropharynx is clear and moist.  Eyes: Conjunctivae are normal. Pupils are equal, round, and reactive to light. Right eye exhibits no discharge. Left eye exhibits no discharge.  Neck: Normal range of motion. Neck supple. No JVD present. No tracheal deviation present. No thyromegaly present.  Cardiovascular: Normal rate, regular rhythm and normal heart sounds.   Pulmonary/Chest: No stridor. No respiratory distress. She has no wheezes. She has rales.  Abdominal: Soft. Bowel sounds are normal. She exhibits no distension and no mass. There is no tenderness. There is no rebound and no guarding.  Musculoskeletal: She exhibits no edema or tenderness.  Lymphadenopathy:    She has no cervical adenopathy.  Neurological: She displays normal reflexes. No cranial nerve deficit. She exhibits normal muscle tone. Coordination normal.  Skin: No rash noted. No erythema.  Psychiatric: She has a normal mood and affect. Her behavior is normal. Judgment and thought content normal.  eryth throat Wax  B Crackles at L base       Assessment & Plan:

## 2014-06-02 NOTE — Progress Notes (Signed)
Pre visit review using our clinic review tool, if applicable. No additional management support is needed unless otherwise documented below in the visit note. 

## 2014-06-02 NOTE — Telephone Encounter (Signed)
[facility] - Client TELEPHONE ADVICE RECORD [facility] Patient Name: Christina [name] Gender: Female DOB: 1934/11/10 Age: 80 Y 2 D Return Phone Number: [phone] (Primary), [phone] (Secondary) Address: [address] City/State/Zip: [place] [address] Client [facility] - Client Client Site [facility] Day Physician Oliver Barre Contact Type Call Call Type Triage / Clinical Relationship To Patient Self Appointment Disposition EMR Appointment Scheduled Return Phone Number [phone] (Primary) Chief Complaint Flu Symptom Initial Comment 1 of 2 Caller states her and her husband has a cough, chills, and feeling bad, thinks it is the flu PreDisposition Home Care Info pasted into Epic Yes Nurse Assessment Nurse: Odis Luster, RN, Bjorn Loser Date/Time (Eastern Time): 06/01/2014 3:40:03 PM Confirm and document reason for call. If symptomatic, describe symptoms. ---Caller states that she has a cough and hurts her back when she coughs. She reports that she is having some wheezing and noisy breathing. Also reports that she had diarrhea x1 and symptoms began on Sunday. Denies fever. Reports that she is coughing up a small amount of phlegm. Reports that she has body aches, but she does has a hx of polio. Has the patient traveled out of the country within the last 30 days? ---Not Applicable Does the patient require triage? ---Yes Related visit to physician within the last 2 weeks? ---No Reports that she has had a flu shot for this year. Does the PT have any chronic conditions? (i.e. diabetes, asthma, etc.) ---Yes List chronic conditions. ---hypertension; hyperlipidemia Guidelines Guideline Title Affirmed Question Affirmed Notes Nurse Date/Time Lamount Cohen Time) Common Cold Marella Chimes, RN, Bjorn Loser 06/01/2014 3:46:17 PM Disp. Time Lamount Cohen Time) Disposition Final User 06/01/2014 3:51:13 PM See Physician within  24 Hours Yes Odis Luster, RN, Juliene Pina Understands: Yes PLEASE NOTE: All timestamps contained within this report are represented as [place] Standard Time. CONFIDENTIALTY NOTICE: This fax transmission is intended only for the addressee. It contains information that is legally privileged, confidential or otherwise protected from use or disclosure. If you are not the intended recipient, you are strictly prohibited from reviewing, disclosing, copying using or disseminating any of this information or taking any action in reliance on or regarding this information. If you have received this fax in error, please notify us immediately by telephone so that we can arrange for its return to [place]. Phone: [phone], Toll-Free: [phone], Fax: [phone] Page: 2 of 2 Call Id: [id number] Disagree/Comply: Comply Care Advice Given Per Guideline SEE PHYSICIAN WITHIN 24 HOURS: * IF OFFICE WILL BE OPEN: You need to be examined within the next 24 hours. Call your doctor when the office opens, and make an appointment. PAIN MEDICINES: * For pain relief, take acetaminophen, ibuprofen, or naproxen. * Use the lowest amount that makes your pain feel better. ACETAMINOPHEN (E.G., TYLENOL): * Take 650 mg (two 325 mg pills) by mouth every 4-6 hours as needed. Each Regular Strength Tylenol pill has 325 mg of acetaminophen. The most you should take each day is 3,250 mg (10 Regular Strength pills a day). * Another choice is to take 1,000 mg (two 500 mg pills) every 8 hours as needed. Each Extra Strength Tylenol pill has 500 mg of acetaminophen. The most you should take each day is 3,000 mg (6 Extra Strength pills a day). FOR A STUFFY NOSE - USE NASAL WASHES: * Introduction: Saline (salt water) nasal irrigation (nasal wash) is an effective and simple home remedy for treating stuffy nose and sinus congestion. The nose can  be irrigated by pouring, spraying, or squirting salt water into the nose and then letting it run back out. *  How it Helps: The salt water rinses out excess mucus, washes out any irritants (dust, allergens) that might be present, and moistens the nasal cavity. * Methods: There are several ways to perform nasal irrigation. You can use a saline nasal spray bottle (available over-the-counter), a rubber ear syringe, a medical syringe without the needle, or a NETI POT. STEP-BY-STEP INSTRUCTIONS: * STEP 1: Lean over a sink. * STEP 2: Gently squirt or spray warm salt water into one of your nostrils. * STEP 3: Some of the water may run into the back of your throat. Spit this out. If you swallow the salt water it will not hurt you. * STEP 4: Blow your nose to clean out the water and mucus. * STEP 5: Repeat steps 1-4 for the other nostril. You can do this a couple times a day if it seems to help you. FOR A RUNNY NOSE - BLOW YOUR NOSE: * Nasal mucus and discharge help wash viruses and bacteria out of the nose and sinuses. * Blowing your nose helps clean out your nose. Use a handkerchief or a paper tissue. * If the skin around your nostrils gets irritated, apply a tiny amount of petroleum ointment to the nasal openings once or twice a day. * OXYMETAZOLINE NASAL DROPS (Afrin) are available OTC. Clean out the nose before using. Spray each nostril once, wait one minute for absorption, and then spray a second time. HUMIDIFIER: If the air in your home is dry, use a humidifier. * Do not take these medications if you have high blood pressure, heart disease, prostate enlargement, or an overactive thyroid. CALL BACK IF: * Fever over 104 F * You have difficulty breathing * You become worse. CARE ADVICE given per Colds (Adult) guideline. After Care Instructions Given Call Event Type User Date / Time Description Comments User: Marlyce Hugehonda, Bowers, RN Date/Time Lamount Cohen(Eastern Time): 06/01/2014 4:28:32 PM Made caller an appt with Dr. Antoine PrimasZachary Smith on 06/02/14 @ 10:15 am, caller voiced understanding. Referrals REFERRED TO PCP OFFICE

## 2014-06-02 NOTE — Assessment & Plan Note (Addendum)
CAP - high morbidity risk condition CXR Prom-cod syr Levaquin Call if not well

## 2014-06-04 ENCOUNTER — Telehealth: Payer: Self-pay | Admitting: *Deleted

## 2014-06-04 NOTE — Telephone Encounter (Signed)
Pt came into the office today after seeing Dr. Posey ReaPlotnikov on yesterday, she was dx with pneumonia. MD rx Levofloxacin. Pt was concern about taking med because when she took Ciprofloxacin she had bicep tendonitis. [name] inform pt that Dr. Posey ReaPlotnikov was out of the office today and that she would ask Dr. Jonny RuizJohn regarding issues. Per Dr. Jonny RuizJohn to let pt know there is minimal risk to getting tendonitis while on medication. [name] inform patient and pt agreed to stay on Levofloxacin. Pt stated she already taken two dose as of yesterday, but had heal off taking today until she came by the office...[name]/lmb

## 2014-06-08 ENCOUNTER — Ambulatory Visit: Payer: Medicare HMO | Admitting: Family Medicine

## 2014-06-17 ENCOUNTER — Encounter: Payer: Self-pay | Admitting: Internal Medicine

## 2014-06-17 ENCOUNTER — Ambulatory Visit (INDEPENDENT_AMBULATORY_CARE_PROVIDER_SITE_OTHER): Payer: Medicare HMO | Admitting: Internal Medicine

## 2014-06-17 VITALS — BP 132/76 | HR 76 | Temp 98.3°F | Resp 18 | Ht 61.0 in | Wt 171.0 lb

## 2014-06-17 DIAGNOSIS — E7800 Pure hypercholesterolemia, unspecified: Secondary | ICD-10-CM

## 2014-06-17 DIAGNOSIS — Z0000 Encounter for general adult medical examination without abnormal findings: Secondary | ICD-10-CM

## 2014-06-17 DIAGNOSIS — I10 Essential (primary) hypertension: Secondary | ICD-10-CM

## 2014-06-17 DIAGNOSIS — J189 Pneumonia, unspecified organism: Secondary | ICD-10-CM

## 2014-06-17 NOTE — Progress Notes (Signed)
Pre visit review using our clinic review tool, if applicable. No additional management support is needed unless otherwise documented below in the visit note. 

## 2014-06-17 NOTE — Progress Notes (Signed)
Subjective:    Patient ID: Fredrich BirksBarbara A Top, female    DOB: 1935-02-25, 80 y.o.   MRN: [id number]  HPI   Here to f/u clinical pna, cxr neg per Dr plotnikov x 2 wks; overall improved, has some persistent cough and bronchial sounds it seems, but no increased cough, productivity, fever, chills, wheezing, sob or CP.  Finished antibx, tolerated well.  Had recent bicep tenodnitis and shoulder DJD pe rsport med., s/p shoulder injectionx sept 2015 and jan 2016. Feeling better with tramadol qhs prn, and gabapentin. Past Medical History  Diagnosis Date  . ALLERGIC RHINITIS 12/31/2007  . ANXIETY 11/30/2006  . BAKER'S CYST, RIGHT KNEE 11/30/2006  . BURSITIS, LEFT HIP 12/31/2007  . Cardiomegaly 05/08/2010  . DEGENERATIVE JOINT DISEASE 11/30/2006  . GASTROENTERITIS WITHOUT DEHYDRATION 06/06/2007  . GERD 11/30/2006  . Headache(784.0) 10/22/2008  . HYPERCHOLESTEROLEMIA 11/30/2006  . HYPERLIPIDEMIA 04/16/2007  . HYPERTENSION 11/30/2006  . OSTEOPOROSIS 10/22/2008  . PERIPHERAL VASCULAR DISEASE 04/16/2007  . 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 VITAMIN D DEFICIENCY 05/08/2010  . Anxiety   . Allergic rhinitis   . Hiatal hernia     with Schatzki's ring  . Diverticulosis   . DJD (degenerative joint disease) of knee     bilateral  . Hemorrhoids   . Carotid artery occlusion 2010  . Complication of anesthesia     very easily sedated-due to hx polio   Past Surgical History  Procedure Laterality Date  . S/p right first finger t rigger finger    . S/p right cea feb 2010      Dr Hart Rochesterlawson  . Breast biopsy  1992    right  . Tonsillectomy and adenoidectomy  age 80  . Left ankle surgery  1949    x 2  . Left great toe fusion  1956  . Endometrial biopsy  1994  . Colonoscopy  2003; 11/2310    2003:External hemorrhoids 2012: diverticulosis, external hemorrhoids  . Post polio Left 1949, 1956  . Calcaneal osteotomy Left 08/28/2012    Procedure: LEFT FOOT DORSAL EXOSTECTOMY;  Surgeon: Toni ArthursJohn Hewitt, MD;  Location: [facility];  Service: Orthopedics;  Laterality: Left;  . Carotid endarterectomy Right 05-26-08    cea    reports that she has never smoked. She has never used smokeless tobacco. She reports that she drinks alcohol. She reports that she does not use illicit drugs. family history includes Anemia in her mother; Arthritis in her maternal grandmother; Breast cancer in her maternal aunt; Cirrhosis in an other family member; Deep vein thrombosis in her father; Heart attack in an other family member; Heart disease in her father; Hypertension in her father and mother; Leukemia in her paternal grandmother; Osteoporosis in her mother; Stroke in her father and mother; Throat cancer in an other family member. There is no history of Colon cancer. Allergies  Allergen Reactions  . Hydromorphone Hcl Anaphylaxis  . Latex Rash  . Sulfonamide Derivatives Rash   Current Outpatient Prescriptions on File Prior to Visit  Medication Sig Dispense Refill  . acetaminophen (TYLENOL) 325 MG tablet Take 650 mg by mouth every 6 (six) hours as needed for mild pain.    Marland Kitchen. aspirin 81 MG EC tablet Take 81 mg by mouth at bedtime.     Marland Kitchen. DIOVAN 320 MG tablet take 1 tablet by mouth once daily (TAKE 1/2 TABLET IF BLOOD PRESSURE IS UNDER 140) 90 tablet 3  . esomeprazole (NEXIUM) 40 MG capsule Take 1 capsule (40 mg total)  by mouth daily. As needed 90 capsule 3  . estradiol (ESTRACE VAGINAL) 0.1 MG/GM vaginal cream Place 1 Applicatorful vaginally 2 (two) times a week. Tues, Thurs    . fluticasone (FLONASE) 50 MCG/ACT nasal spray Place 2 sprays into the nose daily. 16 g 4  . gabapentin (NEURONTIN) 100 MG capsule Take 1 capsule (100 mg total) by mouth at bedtime. 30 capsule 3  . Polyethyl Glycol-Propyl Glycol (SYSTANE ULTRA) 0.4-0.3 % SOLN Apply 1 drop to eye 2 (two) times daily as needed (dry eye).    . promethazine-codeine (PHENERGAN WITH CODEINE) 6.25-10 MG/5ML syrup Take 5 mLs by mouth every 4 (four) hours as needed. 300 mL 0  .  rosuvastatin (CRESTOR) 20 MG tablet Take 1 tablet (20 mg total) by mouth daily. 90 tablet 3  . traMADol (ULTRAM) 50 MG tablet Take 1 tablet (50 mg total) by mouth at bedtime as needed. 30 tablet 0   No current facility-administered medications on file prior to visit.     Review of Systems  Constitutional: Negative for unusual diaphoresis or night sweats HENT: Negative for ringing in ear or discharge Eyes: Negative for double vision or worsening visual disturbance.  Respiratory: Negative for choking and stridor.   Gastrointestinal: Negative for vomiting or other signifcant bowel change Genitourinary: Negative for hematuria or change in urine volume.  Musculoskeletal: Negative for other MSK pain or swelling Skin: Negative for color change and worsening wound.  Neurological: Negative for tremors and numbness other than noted  Psychiatric/Behavioral: Negative for decreased concentration or agitation other than above       Objective:   Physical Exam BP 132/76 mmHg  Pulse 76  Temp(Src) 98.3 F (36.8 C) (Oral)  Resp 18  Ht  (1.549 m)  Wt 171 lb 0.6 oz (77.583 kg)  BMI 32.33 kg/m2  SpO2 94% VS noted,  Constitutional: Pt appears in no significant distress HENT: Head: NCAT.  Right Ear: External ear normal.  Left Ear: External ear normal.  Eyes: . Pupils are equal, round, and reactive to light. Conjunctivae and EOM are normal Neck: Normal range of motion. Neck supple.  Cardiovascular: Normal rate and regular rhythm.   Pulmonary/Chest: Effort normal and somewhat course breath sounds but without rales or wheezing.  Neurological: Pt is alert. Not confused , motor grossly intact Skin: Skin is warm. No rash, no LE edema Psychiatric: Pt behavior is normal. No agitation.      Assessment & Plan:

## 2014-06-17 NOTE — Patient Instructions (Addendum)
Please continue all other medications as before, and refills have been done if requested.  Please have the pharmacy call with any other refills you may need.  Please keep your appointments with your specialists as you may have planned  OK to have your labs done before next visit at your convenience

## 2014-06-17 NOTE — Assessment & Plan Note (Signed)
CXR neg for infiltrate, improved symptoms and exam, Ok to cont cough management, but does not need further antibx, steroid or other at this time, reassured,  to f/u any worsening symptoms or concerns

## 2014-06-17 NOTE — Assessment & Plan Note (Signed)
Pt had concerns about stopping her crestor due to right shoulder pain, but also reassured, ok to cont the statin

## 2014-06-17 NOTE — Assessment & Plan Note (Signed)
stable overall by history and exam, recent data reviewed with pt, and pt to continue medical treatment as before,  to f/u any worsening symptoms or concerns BP Readings from Last 3 Encounters:  06/17/14 132/76  06/02/14 140/90  05/04/14 142/78

## 2014-06-18 ENCOUNTER — Telehealth: Payer: Self-pay | Admitting: Internal Medicine

## 2014-06-18 NOTE — Telephone Encounter (Signed)
emmi emailed °

## 2014-06-23 ENCOUNTER — Other Ambulatory Visit (INDEPENDENT_AMBULATORY_CARE_PROVIDER_SITE_OTHER): Payer: Medicare HMO

## 2014-06-23 DIAGNOSIS — E780 Pure hypercholesterolemia: Secondary | ICD-10-CM

## 2014-06-23 DIAGNOSIS — I10 Essential (primary) hypertension: Secondary | ICD-10-CM

## 2014-06-23 DIAGNOSIS — Z0000 Encounter for general adult medical examination without abnormal findings: Secondary | ICD-10-CM | POA: Diagnosis not present

## 2014-06-23 LAB — CBC WITH DIFFERENTIAL/PLATELET
Basophils Absolute: 0 10*3/uL (ref 0.0–0.1)
Basophils Relative: 0.4 % (ref 0.0–3.0)
EOS ABS: 0.1 10*3/uL (ref 0.0–0.7)
Eosinophils Relative: 2.2 % (ref 0.0–5.0)
HEMATOCRIT: 35.5 % — AB (ref 36.0–46.0)
Hemoglobin: 12.1 g/dL (ref 12.0–15.0)
Lymphocytes Relative: 25.9 % (ref 12.0–46.0)
Lymphs Abs: 1.4 10*3/uL (ref 0.7–4.0)
MCHC: 34.2 g/dL (ref 30.0–36.0)
MCV: 91.4 fl (ref 78.0–100.0)
Monocytes Absolute: 0.5 10*3/uL (ref 0.1–1.0)
Monocytes Relative: 10.3 % (ref 3.0–12.0)
NEUTROS PCT: 61.2 % (ref 43.0–77.0)
Neutro Abs: 3.2 10*3/uL (ref 1.4–7.7)
Platelets: 213 10*3/uL (ref 150.0–400.0)
RBC: 3.89 Mil/uL (ref 3.87–5.11)
RDW: 14.4 % (ref 11.5–15.5)
WBC: 5.3 10*3/uL (ref 4.0–10.5)

## 2014-06-23 LAB — BASIC METABOLIC PANEL
BUN: 15 mg/dL (ref 6–23)
CO2: 29 meq/L (ref 19–32)
Calcium: 9.2 mg/dL (ref 8.4–10.5)
Chloride: 108 mEq/L (ref 96–112)
Creatinine, Ser: 0.6 mg/dL (ref 0.40–1.20)
GFR: 102.74 mL/min (ref >60.00)
GLUCOSE: 87 mg/dL (ref 70–99)
POTASSIUM: 4 meq/L (ref 3.5–5.1)
Sodium: 141 mEq/L (ref 135–145)

## 2014-06-23 LAB — URINALYSIS, ROUTINE W REFLEX MICROSCOPIC
BILIRUBIN URINE: NEGATIVE
Hgb urine dipstick: NEGATIVE
KETONES UR: NEGATIVE
LEUKOCYTES UA: NEGATIVE
Nitrite: NEGATIVE
PH: 5.5 (ref 5.0–8.0)
SPECIFIC GRAVITY, URINE: 1.025 (ref 1.000–1.030)
TOTAL PROTEIN, URINE-UPE24: NEGATIVE
URINE GLUCOSE: NEGATIVE
Urobilinogen, UA: 0.2 (ref 0.0–1.0)

## 2014-06-23 LAB — LIPID PANEL
CHOLESTEROL: 146 mg/dL (ref 0–200)
HDL: 56.1 mg/dL (ref >39.00)
LDL CALC: 70 mg/dL (ref 0–99)
NonHDL: 89.9
TRIGLYCERIDES: 100 mg/dL (ref 0.0–149.0)
Total CHOL/HDL Ratio: 3
VLDL: 20 mg/dL (ref 0.0–40.0)

## 2014-06-23 LAB — HEPATIC FUNCTION PANEL
ALT: 12 U/L (ref 0–35)
AST: 14 U/L (ref 0–37)
Albumin: 4.2 g/dL (ref 3.5–5.2)
Alkaline Phosphatase: 34 U/L — ABNORMAL LOW (ref 39–117)
BILIRUBIN TOTAL: 0.4 mg/dL (ref 0.2–1.2)
Bilirubin, Direct: 0.1 mg/dL (ref 0.0–0.3)
Total Protein: 6.7 g/dL (ref 6.0–8.3)

## 2014-06-23 LAB — TSH: TSH: 1.26 u[IU]/mL (ref 0.35–4.50)

## 2014-06-25 ENCOUNTER — Encounter: Payer: Self-pay | Admitting: Internal Medicine

## 2014-06-25 ENCOUNTER — Ambulatory Visit (INDEPENDENT_AMBULATORY_CARE_PROVIDER_SITE_OTHER): Payer: Medicare HMO | Admitting: Internal Medicine

## 2014-06-25 VITALS — BP 138/80 | HR 67 | Temp 98.7°F | Resp 18 | Ht 61.0 in | Wt 172.0 lb

## 2014-06-25 DIAGNOSIS — Z0000 Encounter for general adult medical examination without abnormal findings: Secondary | ICD-10-CM

## 2014-06-25 NOTE — Progress Notes (Signed)
Subjective:    Patient ID: Christina BirksBarbara A Brymer, female    DOB: 1935-01-19, 80 y.o.   MRN: [id number]  HPI    Here for wellness and f/u;  Overall doing ok;  Pt denies Chest pain, worsening SOB, DOE, wheezing, orthopnea, PND, worsening LE edema, palpitations, dizziness or syncope.  Pt denies neurological change such as new headache, facial or extremity weakness.  Pt denies polydipsia, polyuria, or low sugar symptoms. Pt states overall good compliance with treatment and medications, good tolerability, and has been trying to follow appropriate diet.  Pt denies worsening depressive symptoms, suicidal ideation or panic. No fever, night sweats, wt loss, loss of appetite, or other constitutional symptoms.  Pt states good ability with ADL's, has low fall risk, home safety reviewed and adequate, no other significant changes in hearing or vision, and only occasionally active with exercise.  No current complaints. Did have tramadol per Dr Katrinka BlazingSmith recently, shoulder improved, seems better with gabapentin. Has not taken last 2 nights, doing ok for now, will take tylenol if needed as well.  Plans to move to [place] to be near family if can sell house in [place]. Past Medical History  Diagnosis Date  . ALLERGIC RHINITIS 12/31/2007  . ANXIETY 11/30/2006  . BAKER'S CYST, RIGHT KNEE 11/30/2006  . BURSITIS, LEFT HIP 12/31/2007  . Cardiomegaly 05/08/2010  . DEGENERATIVE JOINT DISEASE 11/30/2006  . GASTROENTERITIS WITHOUT DEHYDRATION 06/06/2007  . GERD 11/30/2006  . Headache(784.0) 10/22/2008  . HYPERCHOLESTEROLEMIA 11/30/2006  . HYPERLIPIDEMIA 04/16/2007  . HYPERTENSION 11/30/2006  . OSTEOPOROSIS 10/22/2008  . PERIPHERAL VASCULAR DISEASE 04/16/2007  . 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 VITAMIN D DEFICIENCY 05/08/2010  . Anxiety   . Allergic rhinitis   . Hiatal hernia     with Schatzki's ring  . Diverticulosis   . DJD (degenerative joint disease) of knee     bilateral  . Hemorrhoids   . Carotid artery occlusion 2010  . Complication  of anesthesia     very easily sedated-due to hx polio   Past Surgical History  Procedure Laterality Date  . S/p right first finger t rigger finger    . S/p right cea feb 2010      Dr Hart Rochesterlawson  . Breast biopsy  1992    right  . Tonsillectomy and adenoidectomy  age 80  . Left ankle surgery  1949    x 2  . Left great toe fusion  1956  . Endometrial biopsy  1994  . Colonoscopy  2003; 11/2310    2003:External hemorrhoids 2012: diverticulosis, external hemorrhoids  . Post polio Left 1949, 1956  . Calcaneal osteotomy Left 08/28/2012    Procedure: LEFT FOOT DORSAL EXOSTECTOMY;  Surgeon: Toni ArthursJohn Hewitt, MD;  Location: [facility];  Service: Orthopedics;  Laterality: Left;  . Carotid endarterectomy Right 05-26-08    cea    reports that she has never smoked. She has never used smokeless tobacco. She reports that she drinks alcohol. She reports that she does not use illicit drugs. family history includes Anemia in her mother; Arthritis in her maternal grandmother; Breast cancer in her maternal aunt; Cirrhosis in an other family member; Deep vein thrombosis in her father; Heart attack in an other family member; Heart disease in her father; Hypertension in her father and mother; Leukemia in her paternal grandmother; Osteoporosis in her mother; Stroke in her father and mother; Throat cancer in an other family member. There is no history of Colon cancer. Allergies  Allergen Reactions  .  Hydromorphone Hcl Anaphylaxis  . Latex Rash  . Sulfonamide Derivatives Rash   Current Outpatient Prescriptions on File Prior to Visit  Medication Sig Dispense Refill  . acetaminophen (TYLENOL) 325 MG tablet Take 650 mg by mouth every 6 (six) hours as needed for mild pain.    Marland Kitchen aspirin 81 MG EC tablet Take 81 mg by mouth at bedtime.     Marland Kitchen DIOVAN 320 MG tablet take 1 tablet by mouth once daily (TAKE 1/2 TABLET IF BLOOD PRESSURE IS UNDER 140) 90 tablet 3  . esomeprazole (NEXIUM) 40 MG capsule Take 1 capsule (40  mg total) by mouth daily. As needed 90 capsule 3  . estradiol (ESTRACE VAGINAL) 0.1 MG/GM vaginal cream Place 1 Applicatorful vaginally 2 (two) times a week. Tues, Thurs    . fluticasone (FLONASE) 50 MCG/ACT nasal spray Place 2 sprays into the nose daily. 16 g 4  . gabapentin (NEURONTIN) 100 MG capsule Take 1 capsule (100 mg total) by mouth at bedtime. 30 capsule 3  . Polyethyl Glycol-Propyl Glycol (SYSTANE ULTRA) 0.4-0.3 % SOLN Apply 1 drop to eye 2 (two) times daily as needed (dry eye).    . rosuvastatin (CRESTOR) 20 MG tablet Take 1 tablet (20 mg total) by mouth daily. 90 tablet 3  . traMADol (ULTRAM) 50 MG tablet Take 1 tablet (50 mg total) by mouth at bedtime as needed. 30 tablet 0   No current facility-administered medications on file prior to visit.   Review of Systems Constitutional: Negative for increased diaphoresis, other activity, appetite or siginficant weight change other than noted HENT: Negative for worsening hearing loss, ear pain, facial swelling, mouth sores and neck stiffness.   Eyes: Negative for other worsening pain, redness or visual disturbance.  Respiratory: Negative for shortness of breath and wheezing  Cardiovascular: Negative for chest pain and palpitations.  Gastrointestinal: Negative for diarrhea, blood in stool, abdominal distention or other pain Genitourinary: Negative for hematuria, flank pain or change in urine volume.  Musculoskeletal: Negative for myalgias or other joint complaints.  Skin: Negative for color change and wound or drainage.  Neurological: Negative for syncope and numbness. other than noted Hematological: Negative for adenopathy. or other swelling Psychiatric/Behavioral: Negative for hallucinations, SI, self-injury, decreased concentration or other worsening agitation.      Objective:   Physical Exam BP 138/80 mmHg  Pulse 67  Temp(Src) 98.7 F (37.1 C) (Oral)  Resp 18  Ht  (1.549 m)  Wt 172 lb 0.3 oz (78.028 kg)  BMI 32.52 kg/m2   SpO2 97% VS noted,  Constitutional: Pt is oriented to person, place, and time. Appears well-developed and well-nourished, in no significant distress Head: Normocephalic and atraumatic.  Right Ear: External ear normal.  Left Ear: External ear normal.  Nose: Nose normal.  Mouth/Throat: Oropharynx is clear and moist.  Eyes: Conjunctivae and EOM are normal. Pupils are equal, round, and reactive to light.  Neck: Normal range of motion. Neck supple. No JVD present. No tracheal deviation present or significant neck LA or mass Cardiovascular: Normal rate, regular rhythm, normal heart sounds and intact distal pulses.   Pulmonary/Chest: Effort normal and breath sounds without rales or wheezing  Abdominal: Soft. Bowel sounds are normal. NT. No HSM  Musculoskeletal: Normal range of motion. Exhibits no edema.  Lymphadenopathy:  Has no cervical adenopathy.  Neurological: Pt is alert and oriented to person, place, and time. Pt has normal reflexes. No cranial nerve deficit. Motor grossly intact Skin: Skin is warm and dry. No rash noted.  Psychiatric:  Has normal mood and affect. Behavior is normal.      Assessment & Plan:

## 2014-06-25 NOTE — Assessment & Plan Note (Signed)

## 2014-06-25 NOTE — Progress Notes (Signed)
Pre visit review using our clinic review tool, if applicable. No additional management support is needed unless otherwise documented below in the visit note. 

## 2014-06-25 NOTE — Patient Instructions (Addendum)
Please continue all other medications as before, and refills have been done if requested.  Please have the pharmacy call with any other refills you may need.  Please continue your efforts at being more active, low cholesterol diet, and weight control.  You are otherwise up to date with prevention measures today.  Please keep your appointments with your specialists as you may have planned  Your lab work and EKG were OK today  Good luck with your move to [place]. , o/w   Please return in 1 year for your yearly visit, or sooner if needed, with Lab testing done 3-5 days before

## 2014-06-29 ENCOUNTER — Encounter: Payer: Self-pay | Admitting: Family

## 2014-06-30 ENCOUNTER — Encounter: Payer: Self-pay | Admitting: Family

## 2014-06-30 ENCOUNTER — Ambulatory Visit (HOSPITAL_COMMUNITY)
Admission: RE | Admit: 2014-06-30 | Discharge: 2014-06-30 | Disposition: A | Discharge status: Home / Self Care | Payer: Medicare HMO | Source: Ambulatory Visit | Attending: Family | Admitting: Family

## 2014-06-30 ENCOUNTER — Ambulatory Visit: Payer: Medicare HMO | Admitting: Family

## 2014-06-30 ENCOUNTER — Ambulatory Visit (INDEPENDENT_AMBULATORY_CARE_PROVIDER_SITE_OTHER): Payer: Medicare HMO | Admitting: Family

## 2014-06-30 ENCOUNTER — Other Ambulatory Visit (HOSPITAL_COMMUNITY): Payer: Medicare HMO

## 2014-06-30 VITALS — BP 138/71 | HR 66 | Resp 16 | Ht 61.0 in | Wt 173.0 lb

## 2014-06-30 DIAGNOSIS — Z48812 Encounter for surgical aftercare following surgery on the circulatory system: Secondary | ICD-10-CM

## 2014-06-30 DIAGNOSIS — Z6832 Body mass index (BMI) 32.0-32.9, adult: Secondary | ICD-10-CM | POA: Diagnosis not present

## 2014-06-30 DIAGNOSIS — E669 Obesity, unspecified: Secondary | ICD-10-CM

## 2014-06-30 DIAGNOSIS — I6522 Occlusion and stenosis of left carotid artery: Secondary | ICD-10-CM | POA: Insufficient documentation

## 2014-06-30 DIAGNOSIS — Z9889 Other specified postprocedural states: Secondary | ICD-10-CM

## 2014-06-30 DIAGNOSIS — I6523 Occlusion and stenosis of bilateral carotid arteries: Secondary | ICD-10-CM

## 2014-06-30 NOTE — Addendum Note (Signed)
Addended by: Sharee PimpleMCCHESNEY, MARILYN K on: 06/30/2014 02:17 PM   Modules accepted: Orders

## 2014-06-30 NOTE — Progress Notes (Signed)
Established Carotid Patient   History of Present Illness  Christina [name] is a 80 y.o. female  patient of Dr. Hart Rochester who is status post right CEA in 2010. She returns today for routine surveillance.  She denies history of MI, denies history of stroke or TIA.  Pt states she will be moving to [place], [place] area near her son.  Does water exercises 3x/week.  The patient denies amaurosis fugax or monocular blindness. The patient denies facial drooping. Pt. denies hemiplegia. The patient denies receptive or expressive aphasia. Pt. denies extremity weakness.  She has occasional aching and numbness in her left arm and mild left leg weakness as residual effects from having had polio at age 35.  Patient reports New Medical or Surgical History: right biceps tendonitis, seen ortho re this and doing exercises to help. She was treated for pneumonia recently.   Pt Diabetic: No Pt smoker: non-smoker  Pt meds include: Statin : Yes ASA: Yes Other anticoagulants/antiplatelets: no  Past Medical History  Diagnosis Date  . ALLERGIC RHINITIS 12/31/2007  . ANXIETY 11/30/2006  . BAKER'S CYST, RIGHT KNEE 11/30/2006  . BURSITIS, LEFT HIP 12/31/2007  . Cardiomegaly 05/08/2010  . DEGENERATIVE JOINT DISEASE 11/30/2006  . GASTROENTERITIS WITHOUT DEHYDRATION 06/06/2007  . GERD 11/30/2006  . Headache(784.0) 10/22/2008  . HYPERCHOLESTEROLEMIA 11/30/2006  . HYPERLIPIDEMIA 04/16/2007  . HYPERTENSION 11/30/2006  . OSTEOPOROSIS 10/22/2008  . PERIPHERAL VASCULAR DISEASE 04/16/2007  . 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 VITAMIN D DEFICIENCY 05/08/2010  . Anxiety   . Allergic rhinitis   . Hiatal hernia     with Schatzki's ring  . Diverticulosis   . DJD (degenerative joint disease) of knee     bilateral  . Hemorrhoids   . Carotid artery occlusion 2010  . Complication of anesthesia     very easily sedated-due to hx polio    Social History History  Substance Use Topics  . Smoking status: Never Smoker   .  Smokeless tobacco: Never Used  . Alcohol Use: Yes     Comment: occ glass of wine     Family History Family History  Problem Relation Age of Onset  . Stroke Father     mother, MGM  . Hypertension Father     mother, MGM  . Deep vein thrombosis Father   . Heart disease Father   . Osteoporosis Mother   . Anemia Mother   . Hypertension Mother   . Stroke Mother   . Leukemia Paternal Grandmother     MGF  . Heart attack      uncle  . Cirrhosis      uncle  . Throat cancer      uncle  . Breast cancer Maternal Aunt   . Arthritis Maternal Grandmother   . Colon cancer Neg Hx     Surgical History Past Surgical History  Procedure Laterality Date  . S/p right first finger t rigger finger    . S/p right cea feb 2010      Dr Hart Rochester  . Breast biopsy  1992    right  . Tonsillectomy and adenoidectomy  age 33  . Left ankle surgery  1949    x 2  . Left great toe fusion  1956  . Endometrial biopsy  1994  . Colonoscopy  2003; 11/2310    2003:External hemorrhoids 2012: diverticulosis, external hemorrhoids  . Post polio Left 1949, 1956  . Calcaneal osteotomy Left 08/28/2012    Procedure: LEFT FOOT DORSAL EXOSTECTOMY;  Surgeon: Jonny Ruiz  Victorino Dike, MD;  Location: [facility];  Service: Orthopedics;  Laterality: Left;  . Carotid endarterectomy Right 05-26-08    cea    Allergies  Allergen Reactions  . Hydromorphone Hcl Anaphylaxis  . Latex Rash  . Sulfonamide Derivatives Rash    Current Outpatient Prescriptions  Medication Sig Dispense Refill  . acetaminophen (TYLENOL) 325 MG tablet Take 650 mg by mouth every 6 (six) hours as needed for mild pain.    Marland Kitchen aspirin 81 MG EC tablet Take 81 mg by mouth at bedtime.     Marland Kitchen DIOVAN 320 MG tablet take 1 tablet by mouth once daily (TAKE 1/2 TABLET IF BLOOD PRESSURE IS UNDER 140) 90 tablet 3  . esomeprazole (NEXIUM) 40 MG capsule Take 1 capsule (40 mg total) by mouth daily. As needed 90 capsule 3  . estradiol (ESTRACE VAGINAL) 0.1 MG/GM  vaginal cream Place 1 Applicatorful vaginally 2 (two) times a week. Tues, Thurs    . fluticasone (FLONASE) 50 MCG/ACT nasal spray Place 2 sprays into the nose daily. 16 g 4  . gabapentin (NEURONTIN) 100 MG capsule Take 1 capsule (100 mg total) by mouth at bedtime. 30 capsule 3  . Polyethyl Glycol-Propyl Glycol (SYSTANE ULTRA) 0.4-0.3 % SOLN Apply 1 drop to eye 2 (two) times daily as needed (dry eye).    . rosuvastatin (CRESTOR) 20 MG tablet Take 1 tablet (20 mg total) by mouth daily. 90 tablet 3  . traMADol (ULTRAM) 50 MG tablet Take 1 tablet (50 mg total) by mouth at bedtime as needed. 30 tablet 0   No current facility-administered medications for this visit.    Review of Systems : See HPI for pertinent positives and negatives.  Physical Examination  Filed Vitals:   06/30/14 1016 06/30/14 1019  BP: 135/74 138/71  Pulse: 62 66  Resp:  16  Height:   (1.549 m)  Weight:  173 lb (78.472 kg)  SpO2:  97%   Body mass index is 32.7 kg/(m^2).   General: WDWN obese female in NAD GAIT: slight limp Eyes: PERRLA Pulmonary: Non-labored, CTAB, Negative Rales, Negative rhonchi, & Negative wheezing.  Cardiac: regular Rhythm, no detected murmur.  VASCULAR EXAM Carotid Bruits Left Right   Negative Negative   Radial pulses are 2+ palpable and equal.      LE Pulses LEFT RIGHT   POPLITEAL not palpable  not palpable    Gastrointestinal: soft, nontender, BS WNL, no r/g, no palpable masses.  Musculoskeletal: Negative muscle atrophy/wasting. M/S 5/5 in UE's, 4/5 in LE's, Extremities without ischemic changes.  Neurologic: A&O X 3; Appropriate Affect ; SENSATION ;normal;  Speech is normal CN 2-12 intact, Pain and light touch intact in extremities, Motor exam as listed above.         Non-Invasive Vascular Imaging CAROTID DUPLEX  06/30/2014   CEREBROVASCULAR DUPLEX EVALUATION    INDICATION: Follow-up carotid disease     PREVIOUS INTERVENTION(S): Right carotid endarterectomy 2010    DUPLEX EXAM:     RIGHT  LEFT  Peak Systolic Velocities (cm/s) End Diastolic Velocities (cm/s) Plaque LOCATION Peak Systolic Velocities (cm/s) End Diastolic Velocities (cm/s) Plaque  123 24  CCA PROXIMAL 160 22   103 22  CCA MID 91 19   60 15  CCA DISTAL 60 13   68 1  ECA 76 1   58 16  ICA PROXIMAL 66 19   86 26  ICA MID 107 34   90 16  ICA DISTAL 78 23  NA ICA / CCA Ratio (PSV) 1.1  Antegrade  Vertebral Flow Antegrade    Brachial Systolic Pressure (mmHg)   Within normal limits  Brachial Artery Waveforms Within normal limits     Plaque Morphology:  HM = Homogeneous, HT = Heterogeneous, CP = Calcific Plaque, SP = Smooth Plaque, IP = Irregular Plaque  ADDITIONAL FINDINGS:     IMPRESSION: 1. Widely patent right carotid endarterectomy without evidence of restenosis or hyperplasia. 2. Evidence of <40% stenosis of the left internal carotid artery.  3. Bilateral vertebral artery is antegrade.     Compared to the previous exam:  No significant change compared to prior exam.       Assessment: Christina [name] is a 80 y.o. female who is status post right CEA in 2010. She has no history of stroke or TIA. Today's carotid Duplex reveals a widely patent right carotid endarterectomy without evidence of restenosis or hyperplasia, evidence of <40% stenosis of the left internal carotid artery. No significant change compared to prior Duplex.   Plan: Follow-up in 1 year with Carotid Duplex.   I discussed in depth with the patient the nature of atherosclerosis, and emphasized the importance of maximal medical management including strict control of blood pressure, blood glucose, and lipid levels, obtaining regular exercise, and continued cessation of smoking.  The patient is aware that without maximal medical management the underlying  atherosclerotic disease process will progress, limiting the benefit of any interventions. The patient was given information about stroke prevention and what symptoms should prompt the patient to seek immediate medical care. Thank you for allowing us to participate in this patient's care.  Charisse MarchSuzanne Cendy Oconnor, RN, MSN, FNP-C [facility] Office: [phone]  Clinic Physician: Edilia BoDickson  06/30/2014 9:47 AM

## 2014-06-30 NOTE — Patient Instructions (Signed)
Stroke Prevention Some medical conditions and behaviors are associated with an increased chance of having a stroke. You may prevent a stroke by making healthy choices and managing medical conditions. HOW CAN I REDUCE MY RISK OF HAVING A STROKE?   Stay physically active. Get at least 30 minutes of activity on most or all days.  Do not smoke. It may also be helpful to avoid exposure to secondhand smoke.  Limit alcohol use. Moderate alcohol use is considered to be:  No more than 2 drinks per day for men.  No more than 1 drink per day for nonpregnant women.  Eat healthy foods. This involves:  Eating 5 or more servings of fruits and vegetables a day.  Making dietary changes that address high blood pressure (hypertension), high cholesterol, diabetes, or obesity.  Manage your cholesterol levels.  Making food choices that are high in fiber and low in saturated fat, trans fat, and cholesterol may control cholesterol levels.  Take any prescribed medicines to control cholesterol as directed by your health care provider.  Manage your diabetes.  Controlling your carbohydrate and sugar intake is recommended to manage diabetes.  Take any prescribed medicines to control diabetes as directed by your health care provider.  Control your hypertension.  Making food choices that are low in salt (sodium), saturated fat, trans fat, and cholesterol is recommended to manage hypertension.  Take any prescribed medicines to control hypertension as directed by your health care provider.  Maintain a healthy weight.  Reducing calorie intake and making food choices that are low in sodium, saturated fat, trans fat, and cholesterol are recommended to manage weight.  Stop drug abuse.  Avoid taking birth control pills.  Talk to your health care provider about the risks of taking birth control pills if you are over 35 years old, smoke, get migraines, or have ever had a blood clot.  Get evaluated for sleep  disorders (sleep apnea).  Talk to your health care provider about getting a sleep evaluation if you snore a lot or have excessive sleepiness.  Take medicines only as directed by your health care provider.  For some people, aspirin or blood thinners (anticoagulants) are helpful in reducing the risk of forming abnormal blood clots that can lead to stroke. If you have the irregular heart rhythm of atrial fibrillation, you should be on a blood thinner unless there is a good reason you cannot take them.  Understand all your medicine instructions.  Make sure that other conditions (such as anemia or atherosclerosis) are addressed. SEEK IMMEDIATE MEDICAL CARE IF:   You have sudden weakness or numbness of the face, arm, or leg, especially on one side of the body.  Your face or eyelid droops to one side.  You have sudden confusion.  You have trouble speaking (aphasia) or understanding.  You have sudden trouble seeing in one or both eyes.  You have sudden trouble walking.  You have dizziness.  You have a loss of balance or coordination.  You have a sudden, severe headache with no known cause.  You have new chest pain or an irregular heartbeat. Any of these symptoms may represent a serious problem that is an emergency. Do not wait to see if the symptoms will go away. Get medical help at once. Call your local emergency services (911 in [place] Do not drive yourself to the hospital. Document Released: 05/03/2004 Document Revised: 08/10/2013 Document Reviewed: 09/26/2012 ExitCare Patient Information 2015 [employer]. This information is not intended to replace advice given   to you by your health care provider. Make sure you discuss any questions you have with your health care provider.  

## 2014-06-30 NOTE — Progress Notes (Signed)
Filed Vitals:   06/30/14 1016 06/30/14 1019  BP: 135/74 138/71  Pulse: 62 66  Resp:  16  Height:  5\' 1"  (1.549 m)  Weight:  173 lb (78.472 kg)  SpO2:  97%  Body mass index is 32.7 kg/(m^2).

## 2014-07-29 ENCOUNTER — Other Ambulatory Visit: Payer: Self-pay | Admitting: Internal Medicine

## 2014-07-30 ENCOUNTER — Other Ambulatory Visit: Payer: Self-pay | Admitting: *Deleted

## 2014-07-30 MED ORDER — GABAPENTIN 100 MG PO CAPS: 100.0000 mg | ORAL_CAPSULE | Freq: Every day | ORAL | Status: DC

## 2014-07-30 NOTE — Telephone Encounter (Signed)
Refill done.  

## 2014-08-04 ENCOUNTER — Telehealth: Payer: Self-pay

## 2014-08-04 ENCOUNTER — Telehealth: Payer: Self-pay | Admitting: Internal Medicine

## 2014-08-04 NOTE — Telephone Encounter (Signed)
Patient left a walk-in note stating she needs a prescription for her wheelchair repairs. The company will not do the repairs unless they have a prescription from her PCP. The prescriptions just simply needs to state "needs wheelchair repairs". She wants the RX to be faxed to [facility] At [phone]

## 2014-08-04 NOTE — Telephone Encounter (Signed)
He couldn't read the prescription for wheelchair batteries that was faxed to them. Please refax. Fax # [phone]

## 2014-08-04 NOTE — Telephone Encounter (Signed)
Re-faxed.

## 2014-08-04 NOTE — Telephone Encounter (Signed)
Rx has been faxed.

## 2014-08-04 NOTE — Telephone Encounter (Signed)
Done hardcopy to Cherina  

## 2014-08-10 ENCOUNTER — Ambulatory Visit (INDEPENDENT_AMBULATORY_CARE_PROVIDER_SITE_OTHER): Payer: Medicare HMO | Admitting: Family Medicine

## 2014-08-10 ENCOUNTER — Encounter: Payer: Self-pay | Admitting: Family Medicine

## 2014-08-10 ENCOUNTER — Telehealth: Payer: Self-pay | Admitting: Internal Medicine

## 2014-08-10 ENCOUNTER — Other Ambulatory Visit (INDEPENDENT_AMBULATORY_CARE_PROVIDER_SITE_OTHER): Payer: Medicare HMO

## 2014-08-10 VITALS — BP 118/82 | HR 80 | Ht 61.0 in | Wt 175.0 lb

## 2014-08-10 DIAGNOSIS — M25511 Pain in right shoulder: Secondary | ICD-10-CM | POA: Diagnosis not present

## 2014-08-10 DIAGNOSIS — M19011 Primary osteoarthritis, right shoulder: Secondary | ICD-10-CM | POA: Diagnosis not present

## 2014-08-10 MED ORDER — TRAMADOL HCL 50 MG PO TABS: 50.0000 mg | ORAL_TABLET | Freq: Four times a day (QID) | ORAL | Status: AC | PRN

## 2014-08-10 NOTE — Progress Notes (Signed)
[name] [address] [place], [place] [address] Phone: [phone] Subjective:    CC: Shoulder pain, right follow up  [id number] Christina [name] is a 80 y.o. female coming in with complaint of right shoulder pain. Patient was seen previously and was diagnosed with more of a subacromial bursitis.  Patient was seen previously and did have a calcific bursitis.  Patient also had x-rays after last exam that did show the patient does have severe osteophytic changes of the shoulder joint itself. Patient's cervical neck x-rays show mild degenerative changes.  Patient is up and seen since January. In January patient was given an injection. Patient states overall she has been doing very well but patient is starting to pack boxes that she is moving to [place] in the next month. Patient has noticed some mild increase in pain again. Starting to give her some discomfort at night as well. Patient did respond very well to the steroid injection and is wondering if she can have another one. Patient does take tramadol on an as-needed basis and does need a refill. Patient had 30 pills prescribed to her in January.     Past medical history, social, surgical and family history all reviewed in electronic medical record.   Review of Systems: No headache, visual changes, nausea, vomiting, diarrhea, constipation, dizziness, abdominal pain, skin rash, fevers, chills, night sweats, weight loss, swollen lymph nodes, body aches, joint swelling, muscle aches, chest pain, shortness of breath, mood changes.   Objective Blood pressure 118/82, pulse 80, height  (1.549 m), weight 175 lb (79.379 kg), SpO2 97 %.  General: No apparent distress alert and oriented x3 mood and affect normal, dressed appropriately.  HEENT: Pupils equal, extraocular movements intact  Respiratory: Patient's speak in full sentences and does not appear short of breath  Cardiovascular: No lower extremity edema, non  tender, no erythema  Skin: Warm dry intact with no signs of infection or rash on extremities or on axial skeleton.  Abdomen: Soft nontender  Neuro: Cranial nerves II through XII are intact, neurovascularly intact in all extremities with 2+ DTRs and 2+ pulses.  Lymph: No lymphadenopathy of posterior or anterior cervical chain or axillae bilaterally.  Gait normal with good balance and coordination.  MSK:  Non tender with full range of motion and good stability and symmetric strength and tone of  elbows, wrist, hip, knee and ankles bilaterally.  Shoulder:right Inspection reveals no abnormalities, atrophy or asymmetry. Palpation is normal with no tenderness over AC joint or bicipital groove. ROM is full in all planes. Rotator cuff strength 4+ out of 5 compared to 5 out of 5 on the contralateral side Continued impingement signs Speeds and Yergason's tests normal. No labral pathology noted with negative Obrien's, negative clunk and good stability. Normal scapular function observed. No painful arc and no drop arm sign. No apprehension sign  Contralateral shoulder unremarkable  Procedure: Real-time Ultrasound Guided Injection of right glenohumeral joint Device: GE Logiq E  Ultrasound guided injection is preferred based studies that show increased duration, increased effect, greater accuracy, decreased procedural pain, increased response rate with ultrasound guided versus blind injection.  Verbal informed consent obtained.  Time-out conducted.  Noted no overlying erythema, induration, or other signs of local infection.  Skin prepped in a sterile fashion.  Local anesthesia: Topical Ethyl chloride.  With sterile technique and under real time ultrasound guidance:  Joint visualized.  23g 1  inch needle inserted posterior approach. Pictures taken for needle placement.  Patient did have injection of 2 cc of 1% lidocaine, 2 cc of 0.5% Marcaine, and 1.0 cc of Kenalog 40 mg/dL. Completed without  difficulty  Pain immediately resolved suggesting accurate placement of the medication.  Advised to call if fevers/chills, erythema, induration, drainage, or persistent bleeding.  Images permanently stored and available for review in the ultrasound unit.  Impression: Technically successful ultrasound guided injection.      Impression and Recommendations:     This case required medical decision making of moderate complexity.

## 2014-08-10 NOTE — Progress Notes (Signed)
Pre visit review using our clinic review tool, if applicable. No additional management support is needed unless otherwise documented below in the visit note. 

## 2014-08-10 NOTE — Assessment & Plan Note (Signed)
Patient was given a repeat injection today. Patient is moving and was given a name of a different orthopedic practice in [place]. We discussed icing regimen as well as given up her scooter for tramadol for any breakthrough pain. Patient monitor closely. Patient is taking gabapentin at night for any of the cervical radiculopathy that could also be contribute in. Patient states that this is been helping. We will continue to refill this as necessary as well. Patient can follow-up with me on an as-needed basis.

## 2014-08-10 NOTE — Patient Instructions (Signed)
Good to see you Ice is your friend Tramadol when you need it Continue the gabapentin [facility].  Dr. Welton FlakesKhan.  Good luck with everything.

## 2014-08-10 NOTE — Telephone Encounter (Signed)
Pt had an appt with Dr. Katrinka BlazingSmith this morning and wanted me to relay to you that she and her Husband, Onalee HuaDavid, finally sold their house here in [place] and will be relocating to [place] at the end of this month/first part of June.  She wanted me to pass along a thank you for all of your care for the two of them over the years.

## 2014-08-25 ENCOUNTER — Telehealth: Payer: Self-pay

## 2014-08-25 NOTE — Telephone Encounter (Signed)
Christina [name] from [facility] has called numerous times stating that they can see the faxes that were sent over for Christina [name]' wheelchair repairs. I have faxed and mailed a copy of the RX to [facility]. The fax was successful. They should receive the mailed copy in a few days.

## 2015-02-20 ENCOUNTER — Other Ambulatory Visit: Payer: Self-pay | Admitting: Family Medicine

## 2015-02-21 NOTE — Telephone Encounter (Signed)
Refill done.  

## 2015-02-28 ENCOUNTER — Other Ambulatory Visit: Payer: Self-pay | Admitting: *Deleted

## 2015-02-28 ENCOUNTER — Telehealth: Payer: Self-pay | Admitting: *Deleted

## 2015-02-28 MED ORDER — NAPROXEN 500 MG PO TABS: 500.0000 mg | ORAL_TABLET | Freq: Two times a day (BID) | ORAL | Status: AC

## 2015-02-28 NOTE — Telephone Encounter (Signed)
Received call pt states [place] has moved to [place], and has set up new appt with provider but appt is not until Feb. Wanting to get a refill on her Naproxen. Inform pt will send enough until; she see new md Feb. Sent to [facility].../lmb

## 2015-07-05 ENCOUNTER — Ambulatory Visit: Payer: Medicare HMO | Admitting: Family

## 2015-07-05 ENCOUNTER — Encounter (HOSPITAL_COMMUNITY): Payer: Medicare HMO

## 2015-12-27 IMAGING — CR DG CHEST 2V
2 series · 2 of 2 positions shown · non-contrast
Comparison: Chest x-ray of 03/20/2010

CLINICAL DATA: Cough, congestion, fever

EXAM:
CHEST  2 VIEW

[view not recorded (1 of 2)]
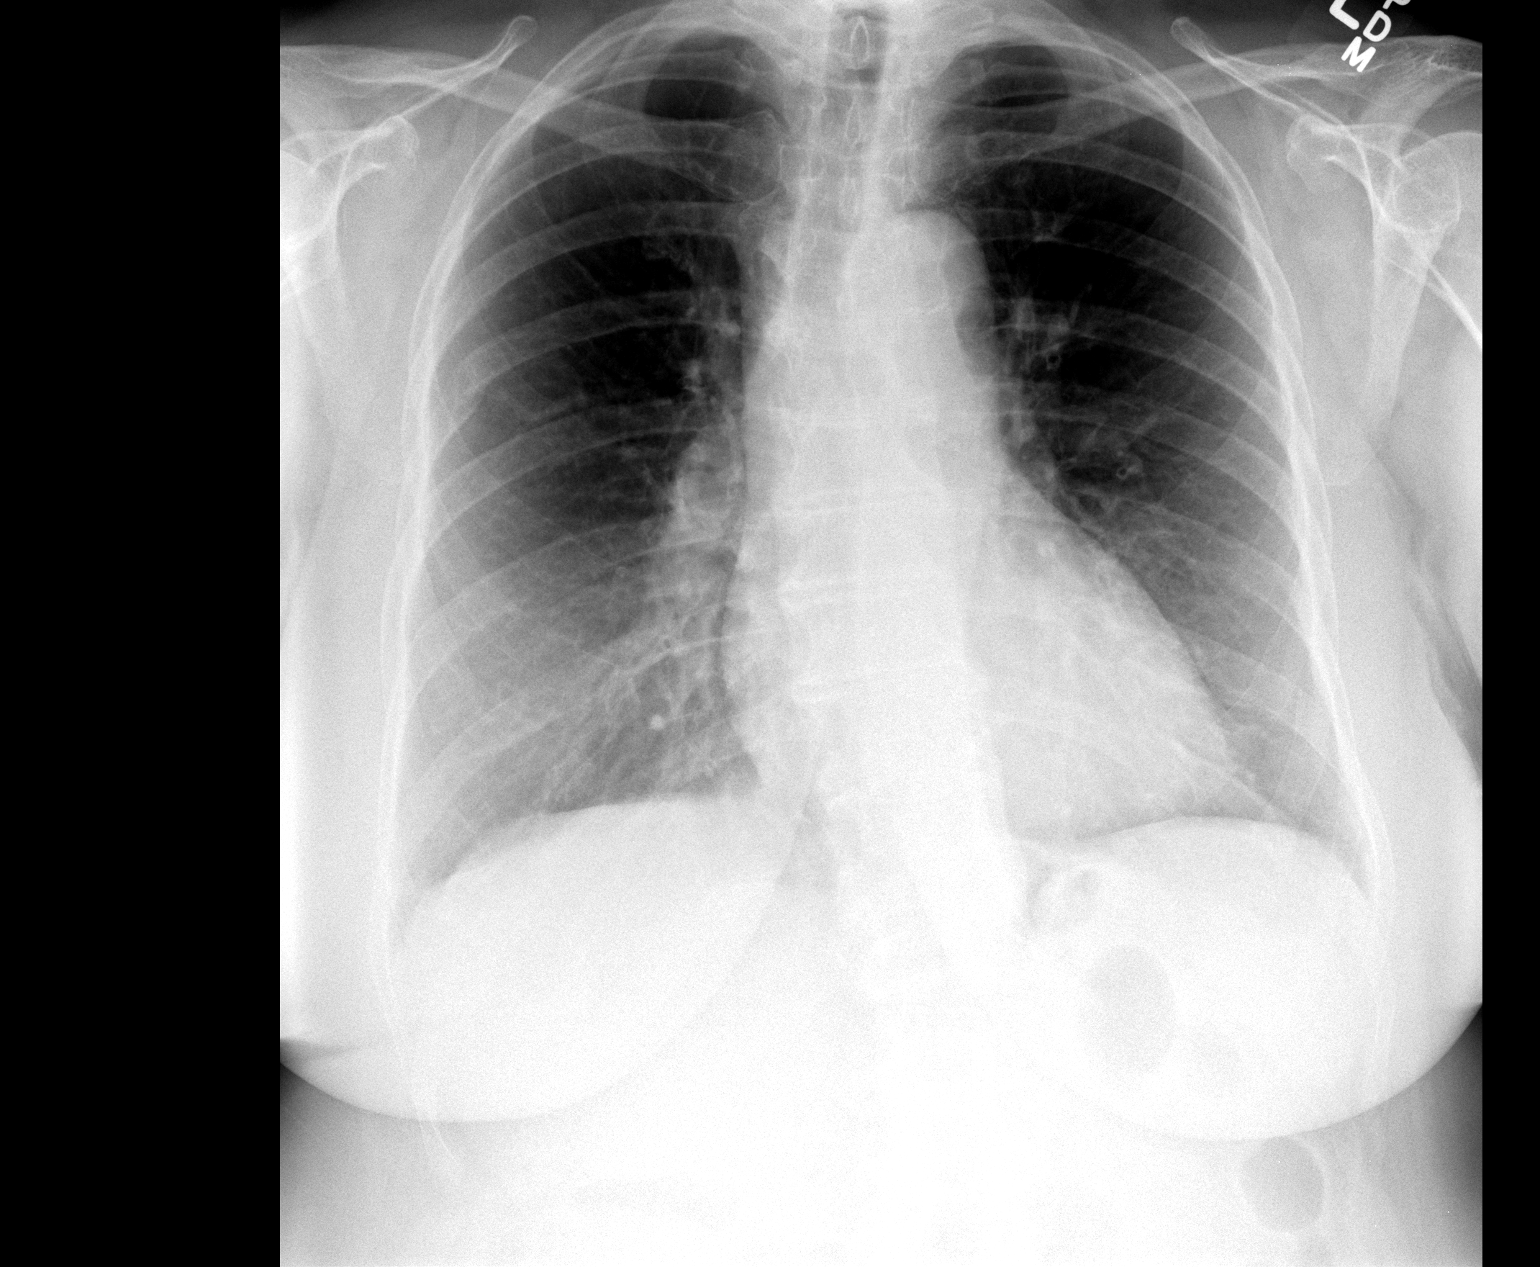

[view not recorded (2 of 2)]
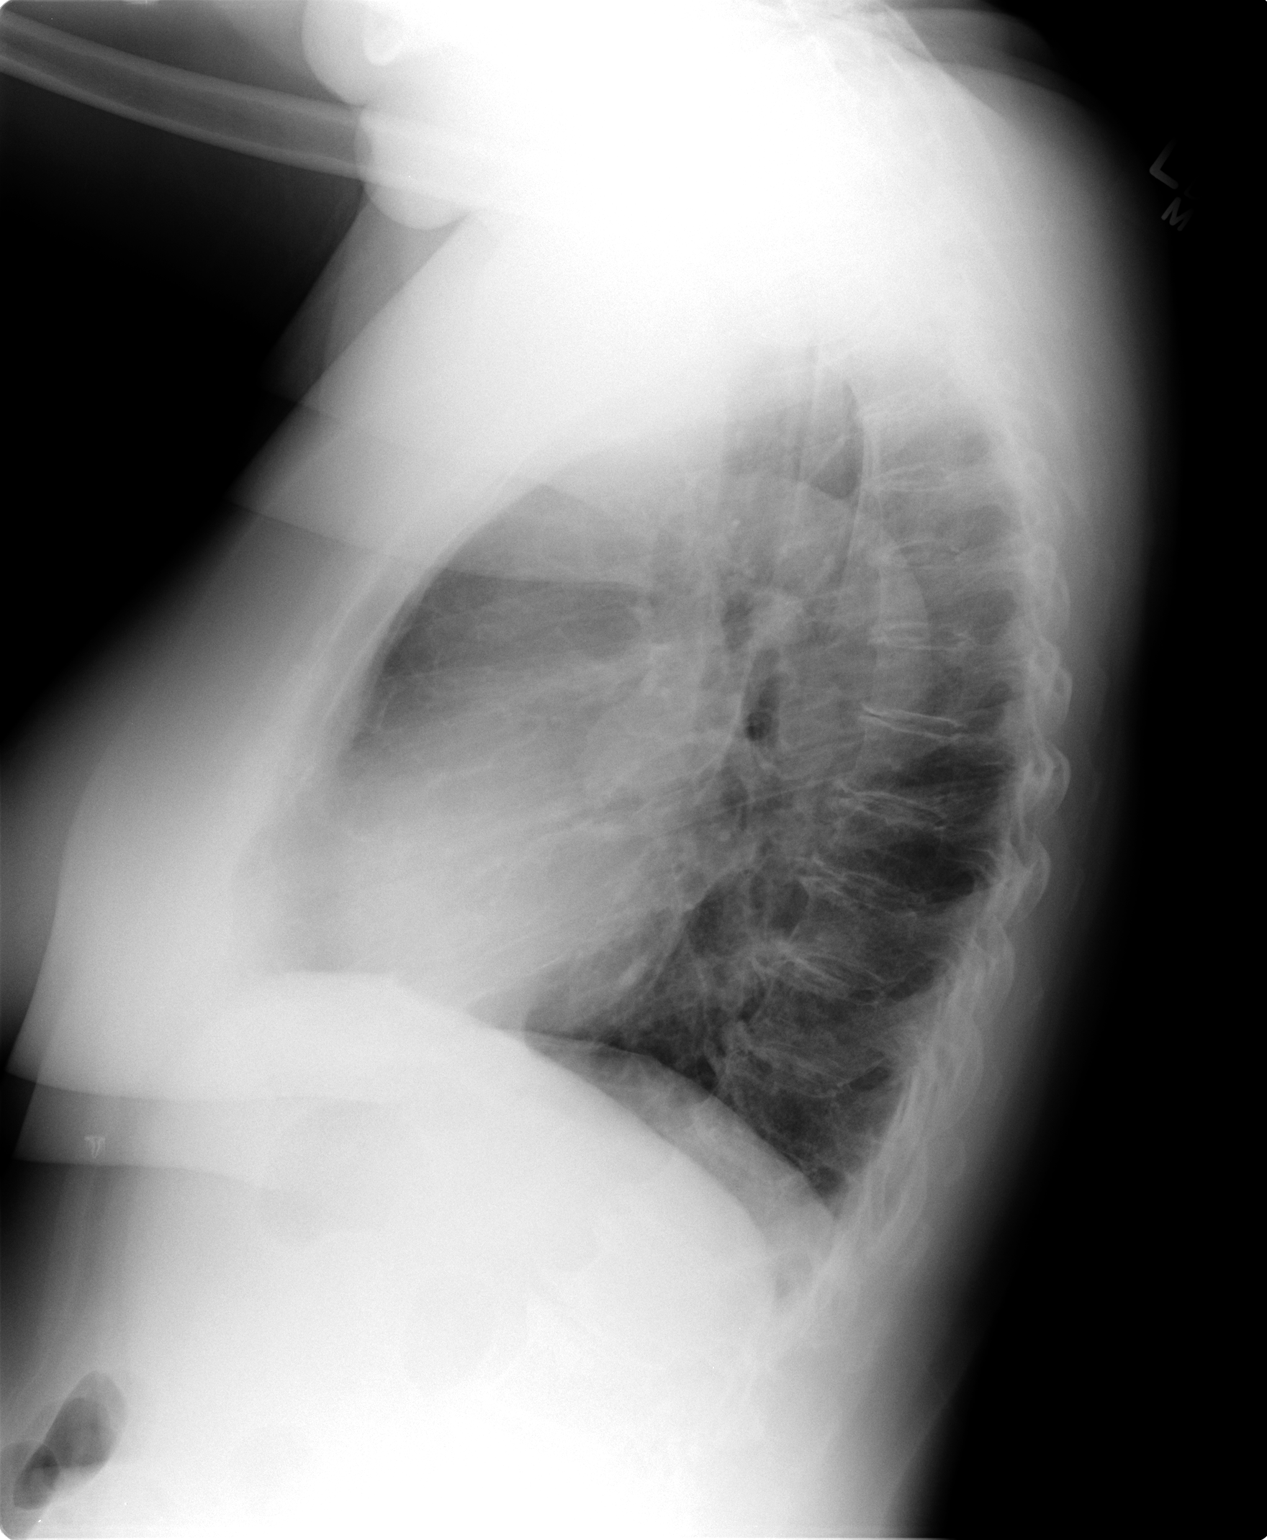

[2 of 2 positions shown; findings below may reference images not displayed]

FINDINGS: No active infiltrate or effusion is seen. Mediastinal and hilar
contours are unremarkable. The heart is within upper limits of
normal. No acute bony abnormality is seen with mild degenerative
change in the mid to lower thoracic spine.
IMPRESSION: No active cardiopulmonary disease.
